# Patient Record
Sex: Female | Born: 2000 | Race: Black or African American | Hispanic: No | Marital: Single | Smoking: Never smoker
Health system: Southern US, Community
[De-identification: ages and names within clinical notes are randomized; demographics above are authoritative.]

## PROBLEM LIST (undated history)

## (undated) DIAGNOSIS — Z9109 Other allergy status, other than to drugs and biological substances: Secondary | ICD-10-CM

## (undated) DIAGNOSIS — F32A Depression, unspecified: Secondary | ICD-10-CM

---
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
## 2022-01-14 ENCOUNTER — Other Ambulatory Visit: Payer: Self-pay

## 2022-01-14 ENCOUNTER — Emergency Department (HOSPITAL_BASED_OUTPATIENT_CLINIC_OR_DEPARTMENT_OTHER)
Admission: EM | Admit: 2022-01-14 | Discharge: 2022-01-14 | Disposition: A | Discharge status: Home / Self Care | Payer: Medicaid Other | Attending: Emergency Medicine | Admitting: Emergency Medicine

## 2022-01-14 DIAGNOSIS — R109 Unspecified abdominal pain: Secondary | ICD-10-CM | POA: Diagnosis present

## 2022-01-14 DIAGNOSIS — K529 Noninfective gastroenteritis and colitis, unspecified: Secondary | ICD-10-CM | POA: Diagnosis not present

## 2022-01-14 DIAGNOSIS — E876 Hypokalemia: Secondary | ICD-10-CM

## 2022-01-14 DIAGNOSIS — R112 Nausea with vomiting, unspecified: Secondary | ICD-10-CM

## 2022-01-14 LAB — BASIC METABOLIC PANEL
Anion gap: 8 (ref 5–15)
BUN: 8 mg/dL (ref 6–20)
CO2: 23 mmol/L (ref 22–32)
Calcium: 8.4 mg/dL — ABNORMAL LOW (ref 8.9–10.3)
Chloride: 107 mmol/L (ref 98–111)
Creatinine, Ser: 0.9 mg/dL (ref 0.44–1.00)
GFR, Estimated: >60 mL/min (ref >60)
Glucose, Bld: 122 mg/dL — ABNORMAL HIGH (ref 70–99)
Potassium: 3.2 mmol/L — ABNORMAL LOW (ref 3.5–5.1)
Sodium: 138 mmol/L (ref 135–145)

## 2022-01-14 LAB — CBC
HCT: 36.9 % (ref 36.0–46.0)
Hemoglobin: 11.9 g/dL — ABNORMAL LOW (ref 12.0–15.0)
MCH: 26.7 pg (ref 26.0–34.0)
MCHC: 32.2 g/dL (ref 30.0–36.0)
MCV: 82.7 fL (ref 80.0–100.0)
Platelets: 270 10*3/uL (ref 150–400)
RBC: 4.46 MIL/uL (ref 3.87–5.11)
RDW: 14 % (ref 11.5–15.5)
WBC: 9.9 10*3/uL (ref 4.0–10.5)
nRBC: 0 % (ref 0.0–0.2)

## 2022-01-14 LAB — URINALYSIS, ROUTINE W REFLEX MICROSCOPIC
Bilirubin Urine: NEGATIVE
Glucose, UA: NEGATIVE mg/dL
Ketones, ur: NEGATIVE mg/dL
Leukocytes,Ua: NEGATIVE
Nitrite: NEGATIVE
Protein, ur: 30 mg/dL — AB
Specific Gravity, Urine: >=1.03 (ref 1.005–1.030)
pH: 5.5 (ref 5.0–8.0)

## 2022-01-14 LAB — URINALYSIS, MICROSCOPIC (REFLEX)

## 2022-01-14 LAB — PREGNANCY, URINE: Preg Test, Ur: NEGATIVE

## 2022-01-14 MED ORDER — METOCLOPRAMIDE HCL 5 MG/ML IJ SOLN: 10.0000 mg | Freq: Once | INTRAMUSCULAR | Status: DC

## 2022-01-14 MED ORDER — DIPHENHYDRAMINE HCL 50 MG/ML IJ SOLN
12.5000 mg | Freq: Once | INTRAMUSCULAR | Status: AC
  Administered 2022-01-14: 12.5 mg via INTRAVENOUS
  Filled 2022-01-14: qty 1

## 2022-01-14 MED ORDER — POTASSIUM CHLORIDE CRYS ER 20 MEQ PO TBCR: 20.0000 meq | EXTENDED_RELEASE_TABLET | Freq: Two times a day (BID) | ORAL | 0 refills | Status: DC

## 2022-01-14 MED ORDER — DROPERIDOL 2.5 MG/ML IJ SOLN
2.5000 mg | Freq: Once | INTRAMUSCULAR | Status: AC
  Administered 2022-01-14: 2.5 mg via INTRAVENOUS
  Filled 2022-01-14: qty 2

## 2022-01-14 MED ORDER — PROCHLORPERAZINE EDISYLATE 10 MG/2ML IJ SOLN
10.0000 mg | Freq: Once | INTRAMUSCULAR | Status: AC
  Administered 2022-01-14: 10 mg via INTRAVENOUS
  Filled 2022-01-14: qty 2

## 2022-01-14 MED ORDER — ONDANSETRON HCL 4 MG/2ML IJ SOLN
4.0000 mg | Freq: Once | INTRAMUSCULAR | Status: AC
  Administered 2022-01-14: 4 mg via INTRAVENOUS
  Filled 2022-01-14: qty 2

## 2022-01-14 MED ORDER — SODIUM CHLORIDE 0.9 % IV BOLUS
1000.0000 mL | Freq: Once | INTRAVENOUS | Status: AC
  Administered 2022-01-14: 1000 mL via INTRAVENOUS

## 2022-01-14 MED ORDER — ONDANSETRON HCL 4 MG PO TABS: 4.0000 mg | ORAL_TABLET | Freq: Four times a day (QID) | ORAL | 0 refills | Status: DC

## 2022-01-14 NOTE — ED Triage Notes (Signed)
Pt accompanied by mom c/o abdominal pain, vomiting x 5 started this morning, nausea since last night. Had [place]bys last night, denies other symptoms.

## 2022-01-14 NOTE — ED Notes (Signed)
Pt ambulatory to restroom

## 2022-01-14 NOTE — ED Provider Notes (Signed)
[facility] EMERGENCY DEPARTMENT Provider Note   CSN: [id number] Arrival date & time: 01/14/22  0347     History  Chief Complaint  Patient presents with   Emesis    Alison Chan is a 21 y.o. female who presents to the emergency department complaining of abdominal pain and vomiting starting this morning. Pt reports waking up the generalized pain in her abdomen and vomiting about 5 times. One episode of diarrhea. No recent travel, new foods or medications. No sick contacts. No fever, urinary symptoms, or vaginal discharge. She is currently on her menstrual cycle.    Emesis Associated symptoms: abdominal pain, chills and diarrhea   Associated symptoms: no fever       Home Medications Prior to Admission medications   Medication Sig Start Date End Date Taking? Authorizing Provider  ondansetron (ZOFRAN) 4 MG tablet Take 1 tablet (4 mg total) by mouth every 6 (six) hours. 01/14/22  Yes Rockie Vawter T, PA-C  potassium chloride SA (KLOR-CON M) 20 MEQ tablet Take 1 tablet (20 mEq total) by mouth 2 (two) times daily for 3 days. 01/14/22 01/17/22 Yes Kullen Tomasetti T, PA-C  famotidine (PEPCID) 20 MG tablet Take 1 tablet (20 mg total) by mouth 2 (two) times daily. 08/16/21   Vanetta Mulders, MD  Hydrocortisone-Aloe 0.5 % CREA APPLY TO AFFECTED AREA TWICE A DAY 05/22/21   Georges Mouse, NP  norgestimate-ethinyl estradiol (SPRINTEC 28) 0.25-35 MG-MCG tablet Take 1 tablet daily. Discard placebos and take active pills for continuous cycling 05/22/21   Georges Mouse, NP  predniSONE (DELTASONE) 10 MG tablet Take 4 tablets (40 mg total) by mouth daily. 08/16/21   Vanetta Mulders, MD      Allergies    Patient has no known allergies.    Review of Systems   Review of Systems  Constitutional:  Positive for chills. Negative for fever.  Gastrointestinal:  Positive for abdominal pain, diarrhea, nausea and vomiting. Negative for blood in stool and constipation.  Genitourinary:   Negative for dysuria, frequency, hematuria, pelvic pain, urgency and vaginal discharge.  All other systems reviewed and are negative.  Physical Exam Updated Vital Signs BP 124/81   Pulse 90   Temp 98 F (36.7 C) (Oral)   Resp 18   Ht 5\' 3"  (1.6 m)   Wt 77.1 kg   LMP 01/07/2022 (Approximate)   SpO2 99%   BMI 30.11 kg/m  Physical Exam Vitals and nursing note reviewed.  Constitutional:      General: She is awake.     Appearance: Normal appearance.     Comments: Appears tired  HENT:     Head: Normocephalic and atraumatic.  Eyes:     Conjunctiva/sclera: Conjunctivae normal.  Cardiovascular:     Rate and Rhythm: Normal rate and regular rhythm.  Pulmonary:     Effort: Pulmonary effort is normal. No respiratory distress.     Breath sounds: Normal breath sounds.  Abdominal:     General: There is no distension.     Palpations: Abdomen is soft.     Tenderness: There is generalized abdominal tenderness and tenderness in the suprapubic area. There is no guarding or rebound.  Skin:    General: Skin is warm and dry.  Neurological:     General: No focal deficit present.  Psychiatric:        Behavior: Behavior is cooperative.    ED Results / Procedures / Treatments   Labs (all labs ordered are listed, but only abnormal results  are displayed) Labs Reviewed  URINALYSIS, ROUTINE W REFLEX MICROSCOPIC - Abnormal; Notable for the following components:      Result Value   APPearance HAZY (*)    Hgb urine dipstick MODERATE (*)    Protein, ur 30 (*)    All other components within normal limits  CBC - Abnormal; Notable for the following components:   Hemoglobin 11.9 (*)    All other components within normal limits  BASIC METABOLIC PANEL - Abnormal; Notable for the following components:   Potassium 3.2 (*)    Glucose, Bld 122 (*)    Calcium 8.4 (*)    All other components within normal limits  URINALYSIS, MICROSCOPIC (REFLEX) - Abnormal; Notable for the following components:    Bacteria, UA FEW (*)    All other components within normal limits  PREGNANCY, URINE    EKG None  Radiology No results found.  Procedures Procedures    Medications Ordered in ED Medications  sodium chloride 0.9 % bolus 1,000 mL (0 mLs Intravenous Stopped 01/14/22 1130)  ondansetron (ZOFRAN) injection 4 mg (4 mg Intravenous Given 01/14/22 0917)  prochlorperazine (COMPAZINE) injection 10 mg (10 mg Intravenous Given 01/14/22 1120)  diphenhydrAMINE (BENADRYL) injection 12.5 mg (12.5 mg Intravenous Given 01/14/22 1119)  droperidol (INAPSINE) 2.5 MG/ML injection 2.5 mg (2.5 mg Intravenous Given 01/14/22 1222)    ED Course/ Medical Decision Making/ A&P                           Medical Decision Making Amount and/or Complexity of Data Reviewed Labs: ordered.  Risk Prescription drug management.  This patient is a 21 y.o. female who presents to the ED for concern of abdominal pain and vomiting, this involves an extensive number of treatment options, and is a complaint that carries with it a high risk of complications and morbidity. The emergent differential diagnosis prior to evaluation includes, but is not limited to,  Pelvic inflammatory disease, ectopic pregnancy, appendicitis, urinary calculi, primary dysmenorrhea, septic abortion, ruptured ovarian cyst or tumor, ovarian torsion, tubo-ovarian abscess, degeneration of fibroid, endometriosis, diverticulitis, cystitis. This is not an exhaustive differential.   Past Medical History / Co-morbidities / Social History: Seasonal allergies  Physical Exam: Physical exam performed. The pertinent findings include: Afebrile, normal vital signs. Abdomen soft with generalized tenderness, worse in suprapubic region without guarding.   Lab Tests: I ordered, and personally interpreted labs.  The pertinent results include:  No leukocytosis, normal hemoglobin. Mild hypokalemia of 3.2, glucose 122. Otherwise electrolytes WNL. Urinalysis with moderate  hemoglobin, likely related to menstruation, negative for infection.    Medications: I ordered medication including IV fluids and zofran  for nausea and vomiting. Reevaluation of the patient after these medicines showed that the patient improved. I have reviewed the patients home medicines and have made adjustments as needed.  Disposition: After consideration of the diagnostic results and the patients response to treatment, I feel that patient is not requiring admission. Her lab work is reassuring minus mild hypokalemia, will prescribe oral replacement. Repeat abdominal exam remains non-surgical. Will discharge to home with anti-emetics and encourage good fluid intake. Suspect likely gastroenteritis. Does not meet SIRS criteria. We discussed reasons to return to the ER, and the patient and her mother are agreeable to the plan.    Final Clinical Impression(s) / ED Diagnoses Final diagnoses:  Nausea vomiting and diarrhea  Gastroenteritis  Hypokalemia    Rx / DC Orders ED Discharge Orders  Ordered    potassium chloride SA (KLOR-CON M) 20 MEQ tablet  2 times daily        01/14/22 1250    ondansetron (ZOFRAN) 4 MG tablet  Every 6 hours        01/14/22 1250           Portions of this report may have been transcribed using voice recognition software. Every effort was made to ensure accuracy; however, inadvertent computerized transcription errors may be present.    Jeanella FlatteryRoemhildt, Jakobee Brackins T, PA-C 01/14/22 1250    Jacalyn LefevreHaviland, Julie, MD 01/15/22 0830

## 2022-01-14 NOTE — Discharge Instructions (Addendum)
You were seen in the emergency department for vomiting.  Your lab work looked reassuring today. Your potassium levels were slightly low, so I am prescribing you some potassium replacement pills that you can take once your vomiting has resolved. I'm also prescribing you some nausea medicine for home. You can take one tablet every 6 hours as needed.  I suspect your symptoms were likely related to a stomach virus. Make sure you are staying well hydrated. Continue to monitor how you're doing and return to the ER for new or worsening symptoms.

## 2022-02-19 ENCOUNTER — Ambulatory Visit (INDEPENDENT_AMBULATORY_CARE_PROVIDER_SITE_OTHER): Payer: Medicaid Other | Admitting: Pediatrics

## 2022-02-19 VITALS — BP 116/70 | HR 90 | Ht 62.6 in | Wt 167.6 lb

## 2022-02-19 DIAGNOSIS — N3001 Acute cystitis with hematuria: Secondary | ICD-10-CM

## 2022-02-19 DIAGNOSIS — Z3202 Encounter for pregnancy test, result negative: Secondary | ICD-10-CM

## 2022-02-19 DIAGNOSIS — L700 Acne vulgaris: Secondary | ICD-10-CM | POA: Diagnosis not present

## 2022-02-19 DIAGNOSIS — Z113 Encounter for screening for infections with a predominantly sexual mode of transmission: Secondary | ICD-10-CM

## 2022-02-19 DIAGNOSIS — N898 Other specified noninflammatory disorders of vagina: Secondary | ICD-10-CM | POA: Diagnosis not present

## 2022-02-19 DIAGNOSIS — Z1389 Encounter for screening for other disorder: Secondary | ICD-10-CM | POA: Diagnosis not present

## 2022-02-19 DIAGNOSIS — N921 Excessive and frequent menstruation with irregular cycle: Secondary | ICD-10-CM

## 2022-02-19 LAB — POCT URINALYSIS DIPSTICK
Bilirubin, UA: NEGATIVE
Glucose, UA: NEGATIVE
Ketones, UA: NEGATIVE
Nitrite, UA: POSITIVE
Protein, UA: POSITIVE — AB
Spec Grav, UA: 1.025 (ref 1.010–1.025)
Urobilinogen, UA: 0.2 E.U./dL
pH, UA: 5 (ref 5.0–8.0)

## 2022-02-19 LAB — POCT URINE PREGNANCY: Preg Test, Ur: NEGATIVE

## 2022-02-19 MED ORDER — PHENAZOPYRIDINE HCL 100 MG PO TABS: 100.0000 mg | ORAL_TABLET | Freq: Three times a day (TID) | ORAL | 0 refills | Status: DC | PRN

## 2022-02-19 MED ORDER — NITROFURANTOIN MONOHYD MACRO 100 MG PO CAPS: 100.0000 mg | ORAL_CAPSULE | Freq: Two times a day (BID) | ORAL | 0 refills | Status: AC

## 2022-02-19 MED ORDER — NITROFURANTOIN MONOHYD MACRO 100 MG PO CAPS: 100.0000 mg | ORAL_CAPSULE | Freq: Two times a day (BID) | ORAL | 0 refills | Status: DC

## 2022-02-19 MED ORDER — NORGESTIMATE-ETH ESTRADIOL 0.25-35 MG-MCG PO TABS: ORAL_TABLET | ORAL | 4 refills | Status: DC

## 2022-02-19 NOTE — Progress Notes (Signed)
History was provided by the patient.  Alison Chan is a 21 y.o. female who is here for vaginal concerns.  Pcp, No   HPI:  Pt reports everything was going well with her birth control but started having some bleeding and spotting. Was using tampons and had a weird vaginal feeling and looked at her vaginal area and said she saw some little holes that were weird. Looked on the internet and when she is using the bathroom she is having blood and blood clots. When she urinates she is having frequent/difficult urination. Sexually active with one female partner. Hasn't had sex for about 2 weeks d/t spotting. Prior was not having any pain with sex. No abdominal pain. Taking birth control pills regularly without missed doses. Usually doesn't have periods due to taking the pill.   No LMP recorded.  ROS  Patient Active Problem List   Diagnosis Date Noted   Iron deficiency anemia due to chronic blood loss 11/26/2020   Cystitis 10/08/2020   Vaginal discharge 10/08/2020   Menorrhagia with regular cycle 10/08/2020    Current Outpatient Medications on File Prior to Visit  Medication Sig Dispense Refill   norgestimate-ethinyl estradiol (SPRINTEC 28) 0.25-35 MG-MCG tablet Take 1 tablet daily. Discard placebos and take active pills for continuous cycling 112 tablet 4   famotidine (PEPCID) 20 MG tablet Take 1 tablet (20 mg total) by mouth 2 (two) times daily. (Patient not taking: Reported on 02/19/2022) 30 tablet 0   Hydrocortisone-Aloe 0.5 % CREA APPLY TO AFFECTED AREA TWICE A DAY (Patient not taking: Reported on 02/19/2022) 30 g 0   ondansetron (ZOFRAN) 4 MG tablet Take 1 tablet (4 mg total) by mouth every 6 (six) hours. (Patient not taking: Reported on 02/19/2022) 12 tablet 0   potassium chloride SA (KLOR-CON M) 20 MEQ tablet Take 1 tablet (20 mEq total) by mouth 2 (two) times daily for 3 days. 6 tablet 0   predniSONE (DELTASONE) 10 MG tablet Take 4 tablets (40 mg total) by mouth daily. (Patient not taking: Reported  on 02/19/2022) 20 tablet 0   No current facility-administered medications on file prior to visit.    No Known Allergies   Physical Exam:    Vitals:   02/19/22 1506  BP: 116/70  Pulse: 90  Weight: 167 lb 9.6 oz (76 kg)  Height: 5' 2.6" (1.59 m)    Blood pressure %iles are not available for patients who are 18 years or older.  Physical Exam Vitals and nursing note reviewed. Exam conducted with a chaperone present.  Constitutional:      General: She is not in acute distress.    Appearance: She is well-developed.  Neck:     Thyroid: No thyromegaly.  Cardiovascular:     Rate and Rhythm: Normal rate and regular rhythm.     Heart sounds: No murmur heard. Pulmonary:     Breath sounds: Normal breath sounds.  Abdominal:     Palpations: Abdomen is soft. There is no mass.     Tenderness: There is no abdominal tenderness. There is no right CVA tenderness, left CVA tenderness or guarding.  Genitourinary:    Labia:        Right: No rash.        Left: No rash.      Vagina: No signs of injury. Bleeding present.     Comments: Internal exam not conducted  Musculoskeletal:     Right lower leg: No edema.     Left lower leg: No edema.  Lymphadenopathy:     Cervical: No cervical adenopathy.  Skin:    General: Skin is warm.     Findings: No rash.  Neurological:     Mental Status: She is alert.     Comments: No tremor     Assessment/Plan: 1. Acute cystitis with hematuria Urine + leuks and nitrites. Will send macrobid now and send for culture. Pyridium as needed for pain.  - Urine Culture - nitrofurantoin, macrocrystal-monohydrate, (MACROBID) 100 MG capsule; Take 1 capsule (100 mg total) by mouth 2 (two) times daily for 5 days.  Dispense: 10 capsule; Refill: 0 - phenazopyridine (PYRIDIUM) 100 MG tablet; Take 1 tablet (100 mg total) by mouth 3 (three) times daily as needed for pain.  Dispense: 10 tablet; Refill: 0  2. Vaginal itching Will get wet prep and treat as indicated.  - WET  PREP BY MOLECULAR PROBE  3. Breakthrough bleeding on OCPs Dicussed can have BTB with infections but also if not taking a break sometimes. Recommended break every 3-4 months to help with this.  - norgestimate-ethinyl estradiol (SPRINTEC 28) 0.25-35 MG-MCG tablet; Take 1 tablet daily. Discard placebos and take active pills for continuous cycling  Dispense: 112 tablet; Refill: 4  4. Acne vulgaris Improved with sprintec  - norgestimate-ethinyl estradiol (SPRINTEC 28) 0.25-35 MG-MCG tablet; Take 1 tablet daily. Discard placebos and take active pills for continuous cycling  Dispense: 112 tablet; Refill: 4  5. Pregnancy examination or test, negative result Neg  - POCT urine pregnancy  6. Screening for genitourinary condition As above  - POCT urinalysis dipstick  7. Routine screening for STI (sexually transmitted infection) Per protocol.  - C. trachomatis/N. gonorrhoeae RNA  Return PRN. ED precautions given.   Alfonso Ramus, FNP

## 2022-02-19 NOTE — Patient Instructions (Addendum)
Will send additional testing and results on mychart  Start macrobid 100 mg twice daily for 5 days  Take pyridium 100 mg three times daily for urinary pain. Will turn urine orange/red  Vaginal tissue is normal  Vaseline for discomfort  Let us know if not improving

## 2022-02-20 ENCOUNTER — Telehealth: Payer: Self-pay | Admitting: *Deleted

## 2022-02-20 LAB — C. TRACHOMATIS/N. GONORRHOEAE RNA
C. trachomatis RNA, TMA: NOT DETECTED
N. gonorrhoeae RNA, TMA: NOT DETECTED

## 2022-02-20 LAB — WET PREP BY MOLECULAR PROBE
Candida species: NOT DETECTED
MICRO NUMBER:: 13613110
SPECIMEN QUALITY:: ADEQUATE
Trichomonas vaginosis: NOT DETECTED

## 2022-02-20 NOTE — Telephone Encounter (Signed)
Patient called with complaints of dizziness, sweats, fever, lightheadedness.  Called and spoke to [facility], she states she took her first dose of Macrobid yesterday evening.  This morning she went to work at 3 am, about 630 am she felt faint like she was going to pass out.  Since then she has felt dizzy and hot off and on.  She correlates the symptoms with taking the Macrobid.  Drink lots of water to stay hydrated.   Spoke to [employer].  Relayed message to patient that she needs to go to the ED if she is having a fever.  Let us know if symptoms are worsening.  Patient understanding.

## 2022-02-21 ENCOUNTER — Other Ambulatory Visit: Payer: Self-pay | Admitting: Pediatrics

## 2022-02-21 LAB — URINE CULTURE
MICRO NUMBER:: 13613122
SPECIMEN QUALITY:: ADEQUATE

## 2022-02-21 MED ORDER — METRONIDAZOLE 500 MG PO TABS
500.0000 mg | ORAL_TABLET | Freq: Two times a day (BID) | ORAL | 0 refills | Status: AC
Start: 2022-02-21 — End: 2022-02-28

## 2022-02-24 ENCOUNTER — Ambulatory Visit: Payer: Medicaid Other | Admitting: Family

## 2022-12-28 ENCOUNTER — Telehealth: Payer: Self-pay | Admitting: Family

## 2022-12-28 ENCOUNTER — Other Ambulatory Visit: Payer: Self-pay | Admitting: Family

## 2022-12-28 ENCOUNTER — Telehealth: Payer: Self-pay

## 2022-12-28 ENCOUNTER — Encounter: Payer: Self-pay | Admitting: Family

## 2022-12-28 NOTE — Telephone Encounter (Signed)
Patient called in and is requesting a refill on her norgestimate-ethinyl estradiol (SPRINTEC 28) 0.25-35 MG-MCG tablet . She wants it sent to [id number], [place] - [address]. Callback for patient is [phone]

## 2022-12-28 NOTE — Telephone Encounter (Signed)
Pt. Called in to say that pharmacy did not have her birthcontrol pills, once I verified the pharmacy she stated it was sent to the wrong one. Pt. said she will pick up the pills at the pharmacy it was sent to this one time, but going forward switch over any refills etc. to the correct pharmacy.  Medication: norgestimate-ethinyl estradiol (SPRINTEC 28) 0.25-35 MG-MCG tablet   Pharmacy:  [id number], [place] - [address]    Thank You!

## 2022-12-29 NOTE — Telephone Encounter (Signed)
Pt. Called in again and agreed to virtual appointment tomorrow 5/15 @ 9:30 am.

## 2022-12-30 ENCOUNTER — Encounter: Payer: Self-pay | Admitting: Family

## 2022-12-30 ENCOUNTER — Telehealth (INDEPENDENT_AMBULATORY_CARE_PROVIDER_SITE_OTHER): Payer: Medicaid Other | Admitting: Family

## 2022-12-30 DIAGNOSIS — L700 Acne vulgaris: Secondary | ICD-10-CM | POA: Diagnosis not present

## 2022-12-30 DIAGNOSIS — N921 Excessive and frequent menstruation with irregular cycle: Secondary | ICD-10-CM | POA: Diagnosis not present

## 2022-12-30 MED ORDER — NORGESTIMATE-ETH ESTRADIOL 0.25-35 MG-MCG PO TABS: ORAL_TABLET | ORAL | 4 refills | Status: DC

## 2022-12-30 NOTE — Progress Notes (Signed)
THIS RECORD MAY CONTAIN CONFIDENTIAL INFORMATION THAT SHOULD NOT BE RELEASED WITHOUT REVIEW OF THE SERVICE PROVIDER.  Virtual Follow-Up Visit via Video Note  I connected with Alison Chan  on 12/30/22 at  9:30 AM EDT by a video enabled telemedicine application and verified that I am speaking with the correct person using two identifiers.   Patient/parent location: home Provider location: remote [place]   I discussed the limitations of evaluation and management by telemedicine and the availability of in person appointments.  I discussed that the purpose of this telehealth visit is to provide medical care while limiting exposure to the novel coronavirus.  The patient expressed understanding and agreed to proceed.   Alison Chan is a 22 y.o. female referred by No ref. provider found here today for follow-up of continuous cycling birth control, needs refills.    History was provided by the patient.  Supervising Physician: Dr. Theadore Nan   Plan from Last Visit:   05/22/2021 1. Breakthrough bleeding on OCPs 2. Acne vulgaris  -will change from 2nd gen to 3rd gen COC for better control of acne s/s.  -remain on continuous cycling  - norgestimate-ethinyl estradiol (SPRINTEC 28) 0.25-35 MG-MCG tablet; Take 1 tablet daily. Discard placebos and take active pills for continuous cycling  Dispense: 112 tablet; Refill: 4   3. Vaginal lesion -apply to affected area twice daily; return precautions given  - hydrocortisone ointment 0.5 %; Apply 1 application topically 2 (two) times daily.  Dispense: 30 g; Refill: 0     Pertinent Labs: negative gc/c 02/19/22  Chief Complaint: Needs refills for birth control   History of Present Illness:  -since being on birth control, has not been having a period  -continuous cycling is working well  -last pill was Saturday with cramping and bleeding  -no pain with intercourse, no discharge changes, no dysuria  -no new medications, OK to take pyridium off  the med list  -needs my chart password reset   No Known Allergies Outpatient Medications Prior to Visit  Medication Sig Dispense Refill   norgestimate-ethinyl estradiol (SPRINTEC 28) 0.25-35 MG-MCG tablet Take 1 tablet daily. Discard placebos and take active pills for continuous cycling 112 tablet 4   phenazopyridine (PYRIDIUM) 100 MG tablet Take 1 tablet (100 mg total) by mouth 3 (three) times daily as needed for pain. 10 tablet 0   No facility-administered medications prior to visit.     Patient Active Problem List   Diagnosis Date Noted   Iron deficiency anemia due to chronic blood loss 11/26/2020   Cystitis 10/08/2020   Vaginal discharge 10/08/2020   Menorrhagia with regular cycle 10/08/2020   The following portions of the patient's history were reviewed and updated as appropriate: allergies, current medications, past family history, past medical history, past social history, past surgical history, and problem list.  Visual Observations/Objective:   General Appearance: Well nourished well developed, in no apparent distress.  Eyes: conjunctiva no swelling or erythema ENT/Mouth: No hoarseness, No cough for duration of visit.  Neck: Supple  Respiratory: Respiratory effort normal, normal rate, no retractions or distress.   Cardio: Appears well-perfused, noncyanotic Musculoskeletal: no obvious deformity Skin: visible skin without rashes, ecchymosis, erythema Neuro: Awake and oriented X 3,  Psych:  normal affect, Insight and Judgment appropriate.    Assessment/Plan: 1. Menorrhagia with irregular cycle 2. Acne vulgaris - norgestimate-ethinyl estradiol (SPRINTEC 28) 0.25-35 MG-MCG tablet; Take 1 tablet daily. Discard placebos and take active pills for continuous cycling  Dispense: 112 tablet; Refill: 4 -discussed  option to wait until cycle ends or restart birth control pills now to try to stop cycle  -her preference is to stop bleeding ASAP  -advised to take 2 pills (one AM and  one PM) until bleeding stops, then return to one pill daily  -discussed adverse effects of twice daily dosing including headaches, nausea -report any new or worsening symptoms   I discussed the assessment and treatment plan with the patient and/or parent/guardian.  They were provided an opportunity to ask questions and all were answered.  They agreed with the plan and demonstrated an understanding of the instructions. They were advised to call back or seek an in-person evaluation in the emergency room if the symptoms worsen or if the condition fails to improve as anticipated.   Follow-up:   as needed, next visit in person to update gc/c screening with is due 02/2023   Georges Mouse, NP    CC: Pcp, No, No ref. provider found

## 2023-01-26 ENCOUNTER — Encounter: Payer: Self-pay | Admitting: Family

## 2023-02-05 ENCOUNTER — Ambulatory Visit
Admission: EM | Admit: 2023-02-05 | Discharge: 2023-02-05 | Disposition: A | Discharge status: Home / Self Care | Payer: Medicaid Other | Attending: Urgent Care | Admitting: Urgent Care

## 2023-02-05 DIAGNOSIS — J020 Streptococcal pharyngitis: Secondary | ICD-10-CM | POA: Diagnosis not present

## 2023-02-05 LAB — POCT RAPID STREP A (OFFICE): Rapid Strep A Screen: POSITIVE — AB

## 2023-02-05 MED ORDER — AMOXICILLIN-POT CLAVULANATE 875-125 MG PO TABS: 1.0000 | ORAL_TABLET | Freq: Two times a day (BID) | ORAL | 0 refills | Status: DC

## 2023-02-05 NOTE — ED Provider Notes (Signed)
[facility]  Note:  This document was prepared using Conservation officer, historic buildings and may include unintentional dictation errors.  MRN: [id number] DOB: 09-28-2000  Subjective:   Alison Chan is a 22 y.o. female presenting for 2-day history of throat pain, painful swallowing, swollen lymph nodes.  Has history of frequent strep infections.  Is concerned that this is what it is.  Last episode was a few months ago.  Has never talked to an ENT doctor.  No current facility-administered medications for this encounter.  Current Outpatient Medications:    norgestimate-ethinyl estradiol (SPRINTEC 28) 0.25-35 MG-MCG tablet, Take 1 tablet daily. Discard placebos and take active pills for continuous cycling, Disp: 112 tablet, Rfl: 4   No Known Allergies  Past Medical History:  Diagnosis Date   Environmental allergies      History reviewed. No pertinent surgical history.  No family history on file.  Social History   Tobacco Use   Smoking status: Never   Smokeless tobacco: Never  Vaping Use   Vaping Use: Every day  Substance Use Topics   Alcohol use: Yes    Comment: occ   Drug use: Not Currently    ROS   Objective:   Vitals: BP 116/72 (BP Location: Right Arm)   Pulse 94   Temp 98.4 F (36.9 C) (Oral)   Resp 18   SpO2 98%   Physical Exam Constitutional:      General: She is not in acute distress.    Appearance: Normal appearance. She is well-developed and normal weight. She is not ill-appearing, toxic-appearing or diaphoretic.  HENT:     Head: Normocephalic and atraumatic.     Right Ear: Tympanic membrane, ear canal and external ear normal. No drainage or tenderness. No middle ear effusion. There is no impacted cerumen. Tympanic membrane is not erythematous or bulging.     Left Ear: Tympanic membrane, ear canal and external ear normal. No drainage or tenderness.  No middle ear effusion. There is no impacted cerumen. Tympanic membrane is not  erythematous or bulging.     Nose: Nose normal. No congestion or rhinorrhea.     Mouth/Throat:     Mouth: Mucous membranes are moist. No oral lesions.     Pharynx: Pharyngeal swelling, oropharyngeal exudate and posterior oropharyngeal erythema present. No uvula swelling.     Tonsils: Tonsillar exudate present. No tonsillar abscesses. 1+ on the right. 1+ on the left.  Eyes:     General: No scleral icterus.       Right eye: No discharge.        Left eye: No discharge.     Extraocular Movements: Extraocular movements intact.     Right eye: Normal extraocular motion.     Left eye: Normal extraocular motion.     Conjunctiva/sclera: Conjunctivae normal.  Cardiovascular:     Rate and Rhythm: Normal rate.  Pulmonary:     Effort: Pulmonary effort is normal.  Musculoskeletal:     Cervical back: Normal range of motion and neck supple.  Lymphadenopathy:     Cervical: No cervical adenopathy.  Skin:    General: Skin is warm and dry.  Neurological:     General: No focal deficit present.     Mental Status: She is alert and oriented to person, place, and time.  Psychiatric:        Mood and Affect: Mood normal.        Behavior: Behavior normal.     Results for orders  placed or performed during the hospital encounter of 02/05/23 (from the past 24 hour(s))  POCT rapid strep A     Status: Abnormal   Collection Time: 02/05/23  3:47 PM  Result Value Ref Range   Rapid Strep A Screen Positive (A) Negative    Assessment and Plan :   PDMP not reviewed this encounter.  1. Strep pharyngitis    Will treat for strep pharyngitis.  Patient is to start Augmentin, use supportive care otherwise.  Follow-up with ENT.  Counseled patient on potential for adverse effects with medications prescribed/recommended today, ER and return-to-clinic precautions discussed, patient verbalized understanding.    Wallis Bamberg, [place] 02/05/23 1625

## 2023-02-05 NOTE — ED Triage Notes (Signed)
Pt c/o sore throat day 2-denies fever-NAD-steady gait

## 2023-02-08 ENCOUNTER — Ambulatory Visit: Payer: Medicaid Other | Admitting: Family

## 2023-02-22 ENCOUNTER — Ambulatory Visit (INDEPENDENT_AMBULATORY_CARE_PROVIDER_SITE_OTHER): Payer: Medicaid Other | Admitting: Family

## 2023-02-22 ENCOUNTER — Other Ambulatory Visit (HOSPITAL_COMMUNITY)
Admission: RE | Admit: 2023-02-22 | Discharge: 2023-02-22 | Disposition: A | Discharge status: Home / Self Care | Payer: Medicaid Other | Source: Ambulatory Visit | Attending: Family | Admitting: Family

## 2023-02-22 ENCOUNTER — Telehealth: Payer: Self-pay

## 2023-02-22 ENCOUNTER — Encounter: Payer: Self-pay | Admitting: Family

## 2023-02-22 ENCOUNTER — Other Ambulatory Visit: Payer: Self-pay | Admitting: Family

## 2023-02-22 VITALS — BP 125/66 | HR 88 | Ht 63.0 in | Wt 189.4 lb

## 2023-02-22 DIAGNOSIS — Z113 Encounter for screening for infections with a predominantly sexual mode of transmission: Secondary | ICD-10-CM | POA: Diagnosis present

## 2023-02-22 DIAGNOSIS — R809 Proteinuria, unspecified: Secondary | ICD-10-CM

## 2023-02-22 DIAGNOSIS — R300 Dysuria: Secondary | ICD-10-CM

## 2023-02-22 DIAGNOSIS — Z3202 Encounter for pregnancy test, result negative: Secondary | ICD-10-CM | POA: Diagnosis not present

## 2023-02-22 DIAGNOSIS — E611 Iron deficiency: Secondary | ICD-10-CM

## 2023-02-22 DIAGNOSIS — N921 Excessive and frequent menstruation with irregular cycle: Secondary | ICD-10-CM

## 2023-02-22 DIAGNOSIS — F4323 Adjustment disorder with mixed anxiety and depressed mood: Secondary | ICD-10-CM | POA: Diagnosis not present

## 2023-02-22 LAB — POCT URINALYSIS DIPSTICK
Bilirubin, UA: NEGATIVE
Blood, UA: POSITIVE
Glucose, UA: NEGATIVE
Ketones, UA: NEGATIVE
Nitrite, UA: NEGATIVE
Protein, UA: POSITIVE — AB
Spec Grav, UA: 1.02 (ref 1.010–1.025)
Urobilinogen, UA: 0.2 E.U./dL
pH, UA: 5 (ref 5.0–8.0)

## 2023-02-22 LAB — POCT HEMOGLOBIN: Hemoglobin: 12 g/dL (ref 11–14.6)

## 2023-02-22 LAB — POCT URINE PREGNANCY: Preg Test, Ur: NEGATIVE

## 2023-02-22 MED ORDER — BUPROPION HCL ER (XL) 150 MG PO TB24
150.0000 mg | ORAL_TABLET | Freq: Every day | ORAL | 0 refills | Status: DC
Start: 2023-02-22 — End: 2024-01-18

## 2023-02-22 NOTE — Progress Notes (Signed)
History was provided by the patient.  Alison Chan is a 22 y.o. female who is here for STI testing.   PCP confirmed? Yes.     Pcp, No  HPI:   - strep 1 week ago, finished abx last Monday 7/1 (symptoms resolved). Towards the end or treatment, felt vaginal irritation and took azo. Has had abdominal pain and cramping for a couple of days with frequent urination. Brief period of dysuria over the weekend but it stopped. Has lower back pain but says that is chronic. Thinks she has been having fever because she gets real ly hot at least once a day for the last 4 days. No vaginal discharge. Has had loose stools for 3 days, not smelly. Diarrhea is better as of this morning. No vulvovaginal redness, swelling, itching. Last sexually active 3 weeks ago, used a condom. Has had 2 partners in the last 6 months.  - concerned about her weight gain within the last year. Feels disgusting. Started going to the gym a couple days ago. Occasionally eats fast food. Some days does not ea t at all because she works third shift and forgets.  - Not having periods, spots a small amount. Continuous cycling. No dysmenorrhea or menorrhagia.  - Feels emotional. Has periods when she feels depressed. Worse in the fall and winter and feels like she has seasonal depression. Sister takes medication for depression. She thinks she may need medication for depression. Did not like therapy when she tried it. Denies marijuana, cocaine, street drug use. Occasional social drinking with friends but not often. Does vape every day to help with anxiety. Has been told she may be ADHD before but feels like her anxious and sad symptoms are worse.  - No seizure history  ROS All others negative except otherwise noted above in HPI  Patient Active Problem List   Diagnosis Date Noted   Iron deficiency anemia due to chronic blood loss 11/26/2020   Cystitis 10/08/2020   Vaginal discharge 10/08/2020   Menorrhagia with regular cycle 10/08/2020     Current Outpatient Medications on File Prior to Visit  Medication Sig Dispense Refill   norgestimate-ethinyl estradiol (SPRINTEC 28) 0.25-35 MG-MCG tablet Take 1 tablet daily. Discard placebos and take active pills for continuous cycling 112 tablet 4   amoxicillin-clavulanate (AUGMENTIN) 875-125 MG tablet Take 1 tablet by mouth 2 (two) times daily. (Patient not taking: Reported on 02/22/2023) 20 tablet 0   No current facility-administered medications on file prior to visit.    No Known Allergies  Physical Exam:    Vitals:   02/22/23 0839  BP: 125/66  Pulse: 88  Weight: 189 lb 6.4 oz (85.9 kg)  Height: 5\' 3"  (1.6 m)    Blood pressure %iles are not available for patients who are 18 years or older. No LMP recorded. (Menstrual status: Oral contraceptives).  Flowsheet Row Office Visit from [facility]/22/2023 in [facility]  PHQ-9 Total Score 9        General: Alert, well-appearing in NAD. Talkative and smiling.  HEENT: Normocephalic, No signs of head trauma. PERRL. EOM intact. Sclerae are anicteric. Moist mucous membranes.  Neck: Supple, no meningismus. No thyromegaly. Cardiovascular: Regular rate and rhythm, S1 and S2 normal. No murmur. Cap refill <2 seconds.  Pulmonary: Normal work of breathing. Clear to auscultation bilaterally with no wheezes or crackles present. Abdomen: Soft, non-tender, non-distended. Extremities: Warm and well-perfused, without cyanosis or edema.  Neurologic: No focal deficits Skin: No rashes  or lesions. Psych: Mood and affect are appropriate.   Assessment/Plan: 22 y.o female presents for routine STI testing.   1. Menorrhagia with irregular cycle: Currently controlled with Sprintec continuous cycling (no menorrhagia or dysmenorrhea). History of iron deficiency anemia but POC hemoglobin stable at 12.0 without iron supplementation. Negative UPT today.  - Continue Sprintec  continuous cycling   2. Routine  screening for STI (sexually transmitted infection) - Urine cytology ancillary only - WET PREP BY MOLECULAR PROBE  3. Adjustment disorder with mixed anxiety and depressed mood: PHQ-9 score of 9 with no active or passive SI. Interested in starting medication to help with anxious thoughts and feelings of sadness. Family history of depression in sister who is treated with SSRI. No h/o seizure. Given recent weight gain with weight concern, along with h/o attention deficits, will prescribe Wellbutrin today. Will follow up mood in 2 weeks. If ongoing concern for ADHD, could consider further evaluating with ADHD pathway with [facility]. - buPROPion (WELLBUTRIN XL) 150 MG 24 hr tablet; Take 1 tablet (150 mg total) by mouth daily.  Dispense: 30 tablet; Refill: 0  5. Dysuria: POCT urinalysis dipstick with 3+ leukocytes, protein, and hematuria after recent antibiotic use. Dysuria and frequent urination have both resolved. Differential includes STI, UTI, vaginal candidiasis, PSGN. Denies abnormal discharge or dark colored urine. No gross hematuria. No fever. It is possible she had a UTI that she has since cleared without antibiotics. GC/C testing sent along with wet prep to evaluate for STI and yeast. Will send culture to follow up abnormal UA.  - Follow up GC/C testing - Follow up wet prep  - Follow up urine culture  - If worsening symptoms with dark of bloody urine, consider PSGN - Advised maintaining adequate hydration with goal of 128 oz of fluid/day  Follow-up: 2 weeks to discuss mood after Wellbutrin initiation   Tereasa Coop, DO Pediatrics, PGY-3

## 2023-02-22 NOTE — Telephone Encounter (Signed)
[place] declined initiation of ADHD pathway at this time.

## 2023-02-23 ENCOUNTER — Other Ambulatory Visit: Payer: Self-pay | Admitting: Family

## 2023-02-23 LAB — URINE CULTURE

## 2023-02-23 LAB — WET PREP BY MOLECULAR PROBE
Candida species: NOT DETECTED
MICRO NUMBER:: 15170483
SPECIMEN QUALITY:: ADEQUATE
Trichomonas vaginosis: NOT DETECTED

## 2023-02-23 MED ORDER — METRONIDAZOLE 500 MG PO TABS: 500.0000 mg | ORAL_TABLET | Freq: Two times a day (BID) | ORAL | 0 refills | Status: AC

## 2023-02-24 ENCOUNTER — Ambulatory Visit
Admission: EM | Admit: 2023-02-24 | Discharge: 2023-02-24 | Disposition: A | Discharge status: Home / Self Care | Payer: Medicaid Other | Attending: Internal Medicine | Admitting: Internal Medicine

## 2023-02-24 ENCOUNTER — Other Ambulatory Visit: Payer: Self-pay | Admitting: Family

## 2023-02-24 ENCOUNTER — Ambulatory Visit (HOSPITAL_COMMUNITY): Admit: 2023-02-24 | Discharge status: Against Medical Advice | Payer: Medicaid Other | Source: Home / Self Care

## 2023-02-24 DIAGNOSIS — R1030 Lower abdominal pain, unspecified: Secondary | ICD-10-CM

## 2023-02-24 DIAGNOSIS — N3001 Acute cystitis with hematuria: Secondary | ICD-10-CM

## 2023-02-24 DIAGNOSIS — Z3202 Encounter for pregnancy test, result negative: Secondary | ICD-10-CM

## 2023-02-24 DIAGNOSIS — R110 Nausea: Secondary | ICD-10-CM

## 2023-02-24 HISTORY — DX: Depression, unspecified: F32.A

## 2023-02-24 LAB — POCT URINALYSIS DIP (MANUAL ENTRY)
Bilirubin, UA: NEGATIVE
Glucose, UA: NEGATIVE mg/dL
Ketones, POC UA: NEGATIVE mg/dL
Nitrite, UA: NEGATIVE
Protein Ur, POC: 100 mg/dL — AB
Spec Grav, UA: 1.015 (ref 1.010–1.025)
Urobilinogen, UA: 0.2 E.U./dL
pH, UA: 6.5 (ref 5.0–8.0)

## 2023-02-24 LAB — URINE CYTOLOGY ANCILLARY ONLY
Bacterial Vaginitis-Urine: NEGATIVE
Candida Urine: NEGATIVE
Chlamydia: NEGATIVE
Comment: NEGATIVE
Comment: NEGATIVE
Comment: NORMAL
Neisseria Gonorrhea: NEGATIVE
Trichomonas: NEGATIVE

## 2023-02-24 LAB — POCT URINE PREGNANCY: Preg Test, Ur: NEGATIVE

## 2023-02-24 MED ORDER — ONDANSETRON 4 MG PO TBDP: 4.0000 mg | ORAL_TABLET | Freq: Three times a day (TID) | ORAL | 0 refills | Status: DC | PRN

## 2023-02-24 MED ORDER — NITROFURANTOIN MONOHYD MACRO 100 MG PO CAPS: 100.0000 mg | ORAL_CAPSULE | Freq: Two times a day (BID) | ORAL | 0 refills | Status: DC

## 2023-02-24 NOTE — ED Triage Notes (Signed)
Pt reports abdominal cramps and diarrhea x 1 week. Pt has not taken any meds for complaints.

## 2023-02-24 NOTE — ED Provider Notes (Signed)
[facility]    CSN: [id number] Arrival date & time: 02/24/23  1212      History   Chief Complaint Chief Complaint  Patient presents with   Abdominal Pain    HPI Alison Chan is a 22 y.o. female.   Patient presents to urgent care for evaluation of abdominal cramping and diarrhea that started 1 week ago. Last bowel movement was this morning and loose/watery.  Reports intermittent nausea without vomiting. Reports lower bilateral abdominal pain that is worse with certain positions and movement. Nothing makes the pain better. She is not currently experiencing any abdominal pain. No blood/mucous to the stools.  No urinary symptoms or vaginal symptoms. Takes oral contraception, does not have withdrawal bleeds. She went to her PCP and was diagnosed with BV and UTI. Macrobid and flagyl were sent to pharmacy by PCP but patient was unaware of this and has not started taking medicines. She now remembers that has been taking AZO for dysuria. No other attempted use of any OTC medicines.    Abdominal Pain   Past Medical History:  Diagnosis Date   Depression    Environmental allergies     Patient Active Problem List   Diagnosis Date Noted   Iron deficiency anemia due to chronic blood loss 11/26/2020   Cystitis 10/08/2020   Vaginal discharge 10/08/2020   Menorrhagia with regular cycle 10/08/2020    History reviewed. No pertinent surgical history.  OB History   No obstetric history on file.      Home Medications    Prior to Admission medications   Medication Sig Start Date End Date Taking? Authorizing Provider  ondansetron (ZOFRAN-ODT) 4 MG disintegrating tablet Take 1 tablet (4 mg total) by mouth every 8 (eight) hours as needed for nausea or vomiting. 02/24/23  Yes Carlisle Beers, FNP  buPROPion (WELLBUTRIN XL) 150 MG 24 hr tablet Take 1 tablet (150 mg total) by mouth daily. 02/22/23   Shropshire, Beatriz, DO  metroNIDAZOLE (FLAGYL) 500 MG tablet Take 1 tablet (500 mg  total) by mouth 2 (two) times daily for 7 days. 02/23/23 03/02/23  Georges Mouse, NP  nitrofurantoin, macrocrystal-monohydrate, (MACROBID) 100 MG capsule Take 1 capsule (100 mg total) by mouth 2 (two) times daily. 02/24/23   Georges Mouse, NP  norgestimate-ethinyl estradiol (SPRINTEC 28) 0.25-35 MG-MCG tablet Take 1 tablet daily. Discard placebos and take active pills for continuous cycling 12/30/22   Georges Mouse, NP    Family History History reviewed. No pertinent family history.  Social History Social History   Tobacco Use   Smoking status: Never   Smokeless tobacco: Never  Vaping Use   Vaping Use: Every day  Substance Use Topics   Alcohol use: Yes    Comment: occ   Drug use: Not Currently     Allergies   Patient has no known allergies.   Review of Systems Review of Systems  Gastrointestinal:  Positive for abdominal pain.  Per HPI   Physical Exam Triage Vital Signs ED Triage Vitals  Enc Vitals Group     BP 02/24/23 1228 117/71     Pulse Rate 02/24/23 1228 84     Resp 02/24/23 1228 16     Temp 02/24/23 1228 98.7 F (37.1 C)     Temp Source 02/24/23 1228 Oral     SpO2 02/24/23 1228 97 %     Weight --      Height --      Head Circumference --  Peak Flow --      Pain Score 02/24/23 1231 8     Pain Loc --      Pain Edu? --      Excl. in GC? --    No data found.  Updated Vital Signs BP 117/71 (BP Location: Left Arm)   Pulse 84   Temp 98.7 F (37.1 C) (Oral)   Resp 16   SpO2 97%   Visual Acuity Right Eye Distance:   Left Eye Distance:   Bilateral Distance:    Right Eye Near:   Left Eye Near:    Bilateral Near:     Physical Exam Vitals and nursing note reviewed.  Constitutional:      Appearance: She is not ill-appearing or toxic-appearing.  HENT:     Head: Normocephalic and atraumatic.     Right Ear: Hearing and external ear normal.     Left Ear: Hearing and external ear normal.     Nose: Nose normal.     Mouth/Throat:     Lips:  Pink.  Eyes:     General: Lids are normal. Vision grossly intact. Gaze aligned appropriately.     Extraocular Movements: Extraocular movements intact.     Conjunctiva/sclera: Conjunctivae normal.  Cardiovascular:     Rate and Rhythm: Normal rate and regular rhythm.     Heart sounds: Normal heart sounds, S1 normal and S2 normal.  Pulmonary:     Effort: Pulmonary effort is normal. No respiratory distress.     Breath sounds: Normal breath sounds and air entry.  Abdominal:     General: Bowel sounds are normal.     Palpations: Abdomen is soft.     Tenderness: There is no abdominal tenderness. There is no right CVA tenderness, left CVA tenderness or guarding.  Musculoskeletal:     Cervical back: Neck supple.  Skin:    General: Skin is warm and dry.     Capillary Refill: Capillary refill takes less than 2 seconds.     Findings: No rash.  Neurological:     General: No focal deficit present.     Mental Status: She is alert and oriented to person, place, and time. Mental status is at baseline.     Cranial Nerves: No dysarthria or facial asymmetry.  Psychiatric:        Mood and Affect: Mood normal.        Speech: Speech normal.        Behavior: Behavior normal.        Thought Content: Thought content normal.        Judgment: Judgment normal.      UC Treatments / Results  Labs (all labs ordered are listed, but only abnormal results are displayed) Labs Reviewed  POCT URINALYSIS DIP (MANUAL ENTRY) - Abnormal; Notable for the following components:      Result Value   Blood, UA moderate (*)    Protein Ur, POC =100 (*)    Leukocytes, UA Small (1+) (*)    All other components within normal limits  POCT URINE PREGNANCY    EKG   Radiology No results found.  Procedures Procedures (including critical care time)  Medications Ordered in UC Medications - No data to display  Initial Impression / Assessment and Plan / UC Course  I have reviewed the triage vital signs and the nursing  notes.  Pertinent labs & imaging results that were available during my care of the patient were reviewed by me and considered in my  medical decision making (see chart for details).   1. Acute cystitis with hematuria, BV, nausea without vomiting, abdominal pain On chart review, patient recently went to her PCP yesterday and urine culture is positive for urinary tract infection with greater than 1000 colonies of gram-negative bacilli.  Advised to pick up Macrobid from pharmacy sent in by PCP for UTI.  This is likely the cause of patient's abdominal discomfort.  No signs of systemic illness/pyelonephritis.  May use Tylenol as needed for pain.  Advised to pick up Flagyl sent to pharmacy by PCP for bacterial vaginosis shown on wet prep performed at clinic yesterday.  May use Zofran 4 mg every 8 hours as needed for nausea and vomiting.  May use over-the-counter Imodium as needed for diarrhea.  No peritoneal signs to abdominal exam.  Currently asymptomatic.  Urine pregnancy test is negative.  Urinalysis shows signs of urinary tract infection.  No indication to repeat urine culture today.  Discussed red flag signs and symptoms of worsening condition,when to call the PCP office, return to urgent care, and when to seek higher level of care in the emergency department. Counseled patient regarding appropriate use of medications and potential side effects for all medications recommended or prescribed today. Patient verbalizes understanding and agreement with plan. Discharged in stable condition.    Final Clinical Impressions(s) / UC Diagnoses   Final diagnoses:  Acute cystitis with hematuria     Discharge Instructions      Pick up medicines for UTI prescribed by PCP and take them. Take Flagyl antibiotic for BV infection. These infections are likely the cause of your abdominal pain.  Take zofran 4mg  underneath the tongue every 8 hours as needed for nausea.  Drink plenty of water to stay well  hydrated and eat bland foods that are easy for your belly to digest.   If you develop any new or worsening symptoms or if your symptoms do not start to improve, pleases return here or follow-up with your primary care provider. If your symptoms are severe, please go to the emergency room.     ED Prescriptions     Medication Sig Dispense Auth. Provider   ondansetron (ZOFRAN-ODT) 4 MG disintegrating tablet Take 1 tablet (4 mg total) by mouth every 8 (eight) hours as needed for nausea or vomiting. 20 tablet Carlisle Beers, FNP      PDMP not reviewed this encounter.   Carlisle Beers, [place] 02/24/23 1324

## 2023-02-24 NOTE — Discharge Instructions (Addendum)
Pick up medicines for UTI prescribed by PCP and take them. Take Flagyl antibiotic for BV infection. These infections are likely the cause of your abdominal pain.  Take zofran 4mg  underneath the tongue every 8 hours as needed for nausea.  Drink plenty of water to stay well hydrated and eat bland foods that are easy for your belly to digest.   If you develop any new or worsening symptoms or if your symptoms do not start to improve, pleases return here or follow-up with your primary care provider. If your symptoms are severe, please go to the emergency room.

## 2023-02-25 ENCOUNTER — Encounter: Payer: Self-pay | Admitting: Family

## 2023-02-25 ENCOUNTER — Other Ambulatory Visit: Payer: Self-pay | Admitting: Family

## 2023-02-25 LAB — URINE CULTURE
MICRO NUMBER:: 15170484
SPECIMEN QUALITY:: ADEQUATE

## 2023-02-25 MED ORDER — SULFAMETHOXAZOLE-TRIMETHOPRIM 800-160 MG PO TABS: 1.0000 | ORAL_TABLET | Freq: Two times a day (BID) | ORAL | 0 refills | Status: AC

## 2023-02-25 NOTE — Progress Notes (Signed)
Supervising Provider Co-Signature  I reviewed with the resident the medical history and the resident's findings on physical examination.  I discussed with the resident the patient's diagnosis and concur with the treatment plan as documented in the resident's note.  Rey Dansby M Adlai Sinning, NP 

## 2023-03-09 ENCOUNTER — Encounter: Payer: Medicaid Other | Admitting: Family

## 2023-04-05 ENCOUNTER — Telehealth: Payer: Self-pay

## 2023-04-05 NOTE — Telephone Encounter (Signed)
Called patient to schedule follow up appointment with [facility] per patient she have moved back to [place] for school no concerns or symptoms at this time patient states that she will call back when she is in [place] to schedule an appointment.

## 2023-08-19 ENCOUNTER — Ambulatory Visit
Admission: EM | Admit: 2023-08-19 | Discharge: 2023-08-19 | Disposition: A | Discharge status: Home / Self Care | Payer: Medicaid Other | Attending: Family Medicine | Admitting: Family Medicine

## 2023-08-19 ENCOUNTER — Encounter: Payer: Self-pay | Admitting: Family

## 2023-08-19 DIAGNOSIS — J029 Acute pharyngitis, unspecified: Secondary | ICD-10-CM

## 2023-08-19 DIAGNOSIS — B349 Viral infection, unspecified: Secondary | ICD-10-CM | POA: Diagnosis present

## 2023-08-19 LAB — POCT RAPID STREP A (OFFICE): Rapid Strep A Screen: NEGATIVE

## 2023-08-19 MED ORDER — LIDOCAINE VISCOUS HCL 2 % MT SOLN
15.0000 mL | Freq: Four times a day (QID) | OROMUCOSAL | 0 refills | Status: DC | PRN
Start: 2023-08-19 — End: 2024-01-18

## 2023-08-19 NOTE — Discharge Instructions (Signed)
 The clinic will contact you with results of the strep throat culture done today if positive.  You may use a lidocaine  gargle as needed for your sore throat.  Gargle and spit do not swallow.  He may also do salt water gargles and warm liquids such as teas and honey.  Over-the-counter Tylenol  or ibuprofen  as needed.  Lots of rest and fluids.  Please follow-up with your PCP in 2 to 3 days for recheck.  Please go to the ER for any worsening symptoms.  I hope you feel better soon!

## 2023-08-19 NOTE — ED Provider Notes (Signed)
 [facility]    CSN: [id number] Arrival date & time: 08/19/23  1546      History   Chief Complaint No chief complaint on file.   HPI Alison Chan is a 23 y.o. female  presents for evaluation of URI symptoms for 1 days. Patient reports associated symptoms of cough, congestion, sore throat, body aches, diarrhea. Denies nausea, vomiting, fevers, ear pain, shortness of breath. Patient does not have a hx of asthma.  Patient does vape.  Reports no sick contacts.  States she was treated for strep throat in November with complete resolution of symptoms.  Pt has taken nothing OTC for symptoms. Pt has no other concerns at this time.   HPI  Past Medical History:  Diagnosis Date   Depression    Environmental allergies     Patient Active Problem List   Diagnosis Date Noted   Iron deficiency anemia due to chronic blood loss 11/26/2020   Cystitis 10/08/2020   Vaginal discharge 10/08/2020   Menorrhagia with regular cycle 10/08/2020    History reviewed. No pertinent surgical history.  OB History   No obstetric history on file.      Home Medications    Prior to Admission medications   Medication Sig Start Date End Date Taking? Authorizing Provider  lidocaine  (XYLOCAINE ) 2 % solution Use as directed 15 mLs in the mouth or throat every 6 (six) hours as needed (sore throat). Gargle and spit do not swallow 08/19/23  Yes Porshia Blizzard, Jodi R, NP  norgestimate -ethinyl estradiol  (SPRINTEC 28) 0.25-35 MG-MCG tablet Take 1 tablet daily. Discard placebos and take active pills for continuous cycling 12/30/22  Yes Joshua Bari HERO, NP  buPROPion  (WELLBUTRIN  XL) 150 MG 24 hr tablet Take 1 tablet (150 mg total) by mouth daily. 02/22/23   Shropshire, Beatriz, DO  ondansetron  (ZOFRAN -ODT) 4 MG disintegrating tablet Take 1 tablet (4 mg total) by mouth every 8 (eight) hours as needed for nausea or vomiting. 02/24/23   Enedelia Dorna HERO, FNP    Family History History reviewed. No pertinent family  history.  Social History Social History   Tobacco Use   Smoking status: Never   Smokeless tobacco: Never  Vaping Use   Vaping status: Every Day  Substance Use Topics   Alcohol use: Yes    Comment: occ   Drug use: Not Currently     Allergies   Patient has no known allergies.   Review of Systems Review of Systems  HENT:  Positive for congestion and sore throat.   Respiratory:  Positive for cough.   Gastrointestinal:  Positive for diarrhea.  Musculoskeletal:  Positive for myalgias.     Physical Exam Triage Vital Signs ED Triage Vitals  Encounter Vitals Group     BP 08/19/23 1705 116/74     Systolic BP Percentile --      Diastolic BP Percentile --      Pulse Rate 08/19/23 1705 97     Resp 08/19/23 1705 16     Temp 08/19/23 1705 99 F (37.2 C)     Temp Source 08/19/23 1705 Oral     SpO2 08/19/23 1705 97 %     Weight --      Height --      Head Circumference --      Peak Flow --      Pain Score 08/19/23 1703 4     Pain Loc --      Pain Education --      Exclude from  Growth Chart --    No data found.  Updated Vital Signs BP 116/74 (BP Location: Left Arm)   Pulse 97   Temp 99 F (37.2 C) (Oral)   Resp 16   SpO2 97%   Visual Acuity Right Eye Distance:   Left Eye Distance:   Bilateral Distance:    Right Eye Near:   Left Eye Near:    Bilateral Near:     Physical Exam Vitals and nursing note reviewed.  Constitutional:      General: She is not in acute distress.    Appearance: She is well-developed. She is not ill-appearing.  HENT:     Head: Normocephalic and atraumatic.     Right Ear: Tympanic membrane and ear canal normal.     Left Ear: Tympanic membrane and ear canal normal.     Nose: Congestion present.     Mouth/Throat:     Mouth: Mucous membranes are moist.     Pharynx: Oropharynx is clear. Uvula midline. Posterior oropharyngeal erythema present.     Tonsils: No tonsillar exudate or tonsillar abscesses.  Eyes:     Conjunctiva/sclera:  Conjunctivae normal.     Pupils: Pupils are equal, round, and reactive to light.  Cardiovascular:     Rate and Rhythm: Normal rate and regular rhythm.     Heart sounds: Normal heart sounds.  Pulmonary:     Effort: Pulmonary effort is normal.     Breath sounds: Normal breath sounds.  Musculoskeletal:     Cervical back: Normal range of motion and neck supple.  Lymphadenopathy:     Cervical: No cervical adenopathy.  Skin:    General: Skin is warm and dry.  Neurological:     General: No focal deficit present.     Mental Status: She is alert and oriented to person, place, and time.  Psychiatric:        Mood and Affect: Mood normal.        Behavior: Behavior normal.      UC Treatments / Results  Labs (all labs ordered are listed, but only abnormal results are displayed) Labs Reviewed  CULTURE, GROUP A STREP [facility])  POCT RAPID STREP A (OFFICE)    EKG   Radiology No results found.  Procedures Procedures (including critical care time)  Medications Ordered in UC Medications - No data to display  Initial Impression / Assessment and Plan / UC Course  I have reviewed the triage vital signs and the nursing notes.  Pertinent labs & imaging results that were available during my care of the patient were reviewed by me and considered in my medical decision making (see chart for details).     Reviewed exam and symptoms with patient.  No red flags.  Negative rapid strep, will culture and contact for any positive results.  Discussed viral illness and symptomatic treatment.  Lidocaine  gargle as needed.  PCP follow-up 2 to 3 days for recheck.  ER precautions reviewed. Final Clinical Impressions(s) / UC Diagnoses   Final diagnoses:  Sore throat  Viral illness     Discharge Instructions      The clinic will contact you with results of the strep throat culture done today if positive.  You may use a lidocaine  gargle as needed for your sore throat.  Gargle and spit do not swallow.   He may also do salt water gargles and warm liquids such as teas and honey.  Over-the-counter Tylenol  or ibuprofen  as needed.  Lots of rest and fluids.  Please follow-up with your PCP in 2 to 3 days for recheck.  Please go to the ER for any worsening symptoms.  I hope you feel better soon!    ED Prescriptions     Medication Sig Dispense Auth. Provider   lidocaine  (XYLOCAINE ) 2 % solution Use as directed 15 mLs in the mouth or throat every 6 (six) hours as needed (sore throat). Gargle and spit do not swallow 100 mL Orvile Corona, Jodi R, NP      PDMP not reviewed this encounter.   Loreda Myla SAUNDERS, NP 08/19/23 1730

## 2023-08-19 NOTE — ED Triage Notes (Signed)
 Pt presents to UC for c/o sore throat and body aches since yesterday. Hx strep throat 2 months ago

## 2023-08-22 LAB — CULTURE, GROUP A STREP (THRC)

## 2024-01-15 ENCOUNTER — Other Ambulatory Visit: Payer: Self-pay | Admitting: Family

## 2024-01-15 DIAGNOSIS — L700 Acne vulgaris: Secondary | ICD-10-CM

## 2024-01-15 DIAGNOSIS — N921 Excessive and frequent menstruation with irregular cycle: Secondary | ICD-10-CM

## 2024-01-16 ENCOUNTER — Encounter: Payer: Self-pay | Admitting: Family

## 2024-01-16 ENCOUNTER — Other Ambulatory Visit: Payer: Self-pay | Admitting: Family

## 2024-01-16 DIAGNOSIS — N921 Excessive and frequent menstruation with irregular cycle: Secondary | ICD-10-CM

## 2024-01-16 DIAGNOSIS — L700 Acne vulgaris: Secondary | ICD-10-CM

## 2024-01-17 ENCOUNTER — Telehealth: Payer: Self-pay

## 2024-01-17 NOTE — Telephone Encounter (Signed)
 Alison Chan NUMBER:  [phone]  MEDICATION(S): sprintec   PREFERRED PHARMACY: [facility] on [address]   ARE YOU CURRENTLY COMPLETELY OUT OF THE MEDICATION? :  yes

## 2024-01-18 ENCOUNTER — Ambulatory Visit (INDEPENDENT_AMBULATORY_CARE_PROVIDER_SITE_OTHER): Admitting: Family

## 2024-01-18 ENCOUNTER — Other Ambulatory Visit (HOSPITAL_COMMUNITY)
Admission: RE | Admit: 2024-01-18 | Discharge: 2024-01-18 | Disposition: A | Discharge status: Home / Self Care | Source: Ambulatory Visit | Attending: Family | Admitting: Family

## 2024-01-18 VITALS — BP 117/71 | HR 89 | Ht 62.6 in | Wt 208.4 lb

## 2024-01-18 DIAGNOSIS — Z68.41 Body mass index (BMI) pediatric, 5th percentile to less than 85th percentile for age: Secondary | ICD-10-CM

## 2024-01-18 DIAGNOSIS — L700 Acne vulgaris: Secondary | ICD-10-CM

## 2024-01-18 DIAGNOSIS — N921 Excessive and frequent menstruation with irregular cycle: Secondary | ICD-10-CM

## 2024-01-18 DIAGNOSIS — Z113 Encounter for screening for infections with a predominantly sexual mode of transmission: Secondary | ICD-10-CM | POA: Diagnosis present

## 2024-01-18 DIAGNOSIS — E559 Vitamin D deficiency, unspecified: Secondary | ICD-10-CM

## 2024-01-18 DIAGNOSIS — F4323 Adjustment disorder with mixed anxiety and depressed mood: Secondary | ICD-10-CM

## 2024-01-18 DIAGNOSIS — R635 Abnormal weight gain: Secondary | ICD-10-CM | POA: Diagnosis not present

## 2024-01-18 DIAGNOSIS — Z3202 Encounter for pregnancy test, result negative: Secondary | ICD-10-CM

## 2024-01-18 DIAGNOSIS — Z6837 Body mass index (BMI) 37.0-37.9, adult: Secondary | ICD-10-CM | POA: Diagnosis not present

## 2024-01-18 LAB — POCT URINE PREGNANCY: Preg Test, Ur: NEGATIVE

## 2024-01-18 MED ORDER — NORGESTIMATE-ETH ESTRADIOL 0.25-35 MG-MCG PO TABS
ORAL_TABLET | ORAL | 4 refills | Status: DC
Start: 2024-01-18 — End: 2024-03-13

## 2024-01-18 MED ORDER — BUPROPION HCL ER (XL) 150 MG PO TB24: 150.0000 mg | ORAL_TABLET | Freq: Every day | ORAL | 0 refills | Status: DC

## 2024-01-18 NOTE — Patient Instructions (Signed)
 Start Wellbutrin  XR 150 mg in morning.

## 2024-01-18 NOTE — Progress Notes (Unsigned)
 History was provided by the {[id number]}.  Alison Chan is a 23 y.o. female who is here for ***.   PCP confirmed? 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    -has been skipping periods with continuous cycling  -will only spot if she is late with pills  -no cramping  -no pain with intercourse -no vaginal discharge changes -no acne, skin is clear    -asking if able to refill the Wellbutrin ; really needs help  -has been on stairclimber and doing some squats   -2023 and 2024 has been 150, 160s -does not eat breakfast, works 3rd shift then eats a little ([employer])  -last time she took Wellbutrin  was last year, last refill  -can tell a big difference  -concern for weight gain; DM runs in her family  -drinks a lot of water daily, about 5 kitchen cups per day      01/18/2024    4:59 PM 02/22/2023    9:27 AM 03/05/2021    4:22 PM  PHQ-SADS Last 3 Score only  PHQ-15 Score 9 12 11   Total GAD-7 Score 10 4 8   PHQ Adolescent Score 8 9 14    ASRS Completed on 01/18/24 Part A:  0/6 Part B:  0/12   Review of Systems  Constitutional: Negative.   Eyes: Negative.   Respiratory:  Positive for shortness of breath (rare, will use 4-4-4 breathing to help).   Cardiovascular:  Positive for chest pain (few minutes when on stairmaster the other day) and orthopnea (will feel this when lying down - the last 3 months (3 x/month)).  Gastrointestinal: Negative.   Genitourinary:  Positive for frequency and urgency.  Musculoskeletal: Negative.   Skin: Negative.   Neurological:  Positive for dizziness (sometimes at work). Negative for tingling, seizures, loss of consciousness and headaches.  Endo/Heme/Allergies:  Positive for environmental allergies.  Does not bruise/bleed easily.     Patient Active Problem List   Diagnosis Date Noted   Iron deficiency anemia due to chronic blood loss 11/26/2020   Cystitis 10/08/2020   Vaginal discharge 10/08/2020   Menorrhagia with regular cycle 10/08/2020    Current Outpatient Medications on File Prior to Visit  Medication Sig Dispense Refill   buPROPion  (WELLBUTRIN  XL) 150 MG 24 hr tablet Take 1 tablet (150 mg total) by mouth daily. (Patient not taking: Reported on 01/18/2024) 30 tablet 0   lidocaine  (XYLOCAINE ) 2 % solution Use as directed 15 mLs in the mouth or throat every 6 (six) hours as needed (sore throat). Gargle and spit do not swallow 100 mL 0   norgestimate -ethinyl estradiol  (SPRINTEC 28) 0.25-35 MG-MCG tablet Take 1 tablet daily. Discard placebos and take active pills for continuous cycling (Patient not taking: Reported on 01/18/2024) 112 tablet 4   ondansetron  (ZOFRAN -ODT) 4 MG disintegrating tablet Take 1 tablet (4 mg total) by mouth every 8 (eight) hours as needed for nausea or vomiting. 20 tablet 0   No current facility-administered medications on file prior to visit.    No Known Allergies  Physical Exam:    Vitals:   01/18/24 1447  BP: 117/71  Pulse: 89  Weight: 208 lb 6.4 oz (94.5 kg)  Height: 5' 2.6" (1.59 m)   Wt Readings from Last 3  Encounters:  01/18/24 208 lb 6.4 oz (94.5 kg)  02/22/23 189 lb 6.4 oz (85.9 kg) (96%, Z= 1.78)*  02/19/22 167 lb 9.6 oz (76 kg) (92%, Z= 1.41)*   * Growth percentiles are based on CDC (Girls, 2-20 Years) data.     Growth %ile SmartLinks can only be used for patients less than 23 years old. No LMP recorded. (Menstrual status: Oral contraceptives).  Physical Exam   Assessment/Plan: ***

## 2024-01-19 ENCOUNTER — Ambulatory Visit: Payer: Self-pay | Admitting: Family

## 2024-01-19 ENCOUNTER — Other Ambulatory Visit: Payer: Self-pay | Admitting: Family

## 2024-01-19 ENCOUNTER — Encounter: Payer: Self-pay | Admitting: Family

## 2024-01-19 LAB — URINE CYTOLOGY ANCILLARY ONLY
Chlamydia: NEGATIVE
Comment: NEGATIVE
Comment: NEGATIVE
Comment: NORMAL
Neisseria Gonorrhea: NEGATIVE
Trichomonas: NEGATIVE

## 2024-01-19 MED ORDER — VITAMIN D (ERGOCALCIFEROL) 1.25 MG (50000 UNIT) PO CAPS: 50000.0000 [IU] | ORAL_CAPSULE | ORAL | 0 refills | Status: DC

## 2024-02-02 ENCOUNTER — Encounter: Payer: Self-pay | Admitting: Family

## 2024-02-02 ENCOUNTER — Telehealth (INDEPENDENT_AMBULATORY_CARE_PROVIDER_SITE_OTHER): Admitting: Family

## 2024-02-02 DIAGNOSIS — F4323 Adjustment disorder with mixed anxiety and depressed mood: Secondary | ICD-10-CM

## 2024-02-02 NOTE — Progress Notes (Signed)
 THIS RECORD MAY CONTAIN CONFIDENTIAL INFORMATION THAT SHOULD NOT BE RELEASED WITHOUT REVIEW OF THE SERVICE PROVIDER.  Virtual Follow-Up Visit via Video Note  I connected with Alison Chan  on 02/02/24 at 10:00 AM EDT by a video enabled telemedicine application and verified that I am speaking with the correct person using two identifiers.   Patient/parent location: home Provider location: remote, [place]   I discussed the limitations of evaluation and management by telemedicine and the availability of in person appointments.  I discussed that the purpose of this telehealth visit is to provide medical care while limiting exposure to the novel coronavirus.  The patient expressed understanding and agreed to proceed.   Alison Chan is a 23 y.o. female referred by No ref. provider found here today for follow-up adjustment disorder with mixed anxiety and depressed mood.    History was provided by the patient.  Supervising Physician: Dr. Lavonda Pour   Plan from Last Visit 01/18/24:   1. Weight gain (Primary) -monitoring labs today; referral to [facility]  -discussed healthy lifestyle changes - CBC with Differential/Platelet - Comprehensive metabolic panel with GFR - Hemoglobin A1c - Lipid panel - Amb Ref to Medical Weight Management   2. Adjustment disorder with mixed anxiety and depressed mood -PHQSADS consistent with increased anxiety and depressive scores; safety confirmed; restart Wellbutrin  XR 150 mg; return precautions reviewed; follow up in 2 weeks - buPROPion  (WELLBUTRIN  XL) 150 MG 24 hr tablet; Take 1 tablet (150 mg total) by mouth daily.  Dispense: 30 tablet; Refill: 0   3. Menorrhagia with irregular cycle 4. Acne vulgaris - norgestimate -ethinyl estradiol  (SPRINTEC 28) 0.25-35 MG-MCG tablet; Take 1 tablet daily. Discard placebos and take active pills for continuous cycling  Dispense: 112 tablet; Refill: 4     5. Vitamin D  deficiency -screening today  - VITAMIN D   25 Hydroxy (Vit-D Deficiency, Fractures)   6. BMI 37.0-37.9, adult -referral as above 7. Routine screening for STI (sexually transmitted infection) - Urine cytology ancillary only   8. Pregnancy examination or test, negative result - POCT urine pregnancy  Chief Complaint: -no concerns  History of Present Illness:  -started taking Wellbutrin  and feels she has more energy  -is sleeping better  -no changes in appetite, no adverse side effects -no bleeding or cramping with birth control pill use   No Known Allergies Outpatient Medications Prior to Visit  Medication Sig Dispense Refill   buPROPion  (WELLBUTRIN  XL) 150 MG 24 hr tablet Take 1 tablet (150 mg total) by mouth daily. 30 tablet 0   norgestimate -ethinyl estradiol  (SPRINTEC 28) 0.25-35 MG-MCG tablet Take 1 tablet daily. Discard placebos and take active pills for continuous cycling 112 tablet 4   Vitamin D , Ergocalciferol , (DRISDOL ) 1.25 MG (50000 UNIT) CAPS capsule Take 1 capsule (50,000 Units total) by mouth every 7 (seven) days. 8 capsule 0   No facility-administered medications prior to visit.     Patient Active Problem List   Diagnosis Date Noted   Iron deficiency anemia due to chronic blood loss 11/26/2020   Cystitis 10/08/2020   Vaginal discharge 10/08/2020   Menorrhagia with regular cycle 10/08/2020    The following portions of the patient's history were reviewed and updated as appropriate: allergies, current medications, past family history, past medical history, past social history, past surgical history, and problem list.  Visual Observations/Objective:   General Appearance: Well nourished well developed, in no apparent distress.  Eyes: conjunctiva no swelling or erythema ENT/Mouth: No hoarseness, No cough for duration of visit.  Neck: Supple  Respiratory: Respiratory effort normal, normal rate, no retractions or distress.   Cardio: Appears well-perfused, noncyanotic Musculoskeletal: no obvious  deformity Skin: visible skin without rashes, ecchymosis, erythema Neuro: Awake and oriented X 3,  Psych:  normal affect, Insight and Judgment appropriate.    Assessment/Plan: 1. Adjustment disorder with mixed anxiety and depressed mood (Primary) -continue with Wellbutrin  XL 150 mg daily in AM  -return precautions reviewed -phone number given via My Chart for [facility] office to follow up on referral placed  -referral for [facility] if she is interested in following up on therapy   I discussed the assessment and treatment plan with the patient and/or parent/guardian.  They were provided an opportunity to ask questions and all were answered.  They agreed with the plan and demonstrated an understanding of the instructions. They were advised to call back or seek an in-person evaluation in the emergency room if the symptoms worsen or if the condition fails to improve as anticipated.   Follow-up:   3 months or sooner if needed    Marijean Shouts, NP    CC: Pcp, No, No ref. provider found

## 2024-02-14 ENCOUNTER — Encounter (INDEPENDENT_AMBULATORY_CARE_PROVIDER_SITE_OTHER): Payer: Self-pay

## 2024-02-14 ENCOUNTER — Other Ambulatory Visit: Payer: Self-pay | Admitting: Family

## 2024-02-14 DIAGNOSIS — F4323 Adjustment disorder with mixed anxiety and depressed mood: Secondary | ICD-10-CM

## 2024-02-22 ENCOUNTER — Institutional Professional Consult (permissible substitution): Payer: Self-pay

## 2024-02-23 ENCOUNTER — Telehealth: Payer: Self-pay

## 2024-02-23 NOTE — Telephone Encounter (Signed)
 Called main number on file to rs missed 7/8 appt na nvm set up

## 2024-03-13 ENCOUNTER — Ambulatory Visit (INDEPENDENT_AMBULATORY_CARE_PROVIDER_SITE_OTHER): Admitting: Family

## 2024-03-13 ENCOUNTER — Encounter: Payer: Self-pay | Admitting: Family

## 2024-03-13 VITALS — BP 108/64 | HR 88 | Ht 63.0 in | Wt 209.2 lb

## 2024-03-13 DIAGNOSIS — R7309 Other abnormal glucose: Secondary | ICD-10-CM

## 2024-03-13 DIAGNOSIS — L700 Acne vulgaris: Secondary | ICD-10-CM

## 2024-03-13 DIAGNOSIS — Z6837 Body mass index (BMI) 37.0-37.9, adult: Secondary | ICD-10-CM

## 2024-03-13 DIAGNOSIS — N921 Excessive and frequent menstruation with irregular cycle: Secondary | ICD-10-CM

## 2024-03-13 DIAGNOSIS — F4323 Adjustment disorder with mixed anxiety and depressed mood: Secondary | ICD-10-CM

## 2024-03-13 DIAGNOSIS — R635 Abnormal weight gain: Secondary | ICD-10-CM | POA: Diagnosis not present

## 2024-03-13 MED ORDER — BUPROPION HCL ER (XL) 150 MG PO TB24: 150.0000 mg | ORAL_TABLET | Freq: Every day | ORAL | 0 refills | Status: DC

## 2024-03-13 MED ORDER — NORGESTIMATE-ETH ESTRADIOL 0.25-35 MG-MCG PO TABS
ORAL_TABLET | ORAL | 4 refills | Status: AC
Start: 2024-03-13 — End: ?

## 2024-03-13 MED ORDER — VITAMIN D (ERGOCALCIFEROL) 1.25 MG (50000 UNIT) PO CAPS: 50000.0000 [IU] | ORAL_CAPSULE | ORAL | 0 refills | Status: AC

## 2024-03-13 NOTE — Progress Notes (Signed)
 History was provided by the patient.  Alison Chan is a 23 y.o. female who is here for adjustment disorder with mixed anxiety and depressed mood.   PCP confirmed? No  Plan from last visit:  1. Adjustment disorder with mixed anxiety and depressed mood (Primary) -continue with Wellbutrin  XL 150 mg daily in AM  -return precautions reviewed -phone number given via My Chart for [facility] office to follow up on referral placed   -referral for [facility] if she is interested in following up on therapy   Pertinent Labs:  Vitamin D  10  A1c 5.9    Chart/Growth Chart Review:  Body mass index is 37.06 kg/m.   HPI:   -goes to school - [employer] in [place], Aug 16  -called [facility]; told her she had to pay $100 for first visit -taking Wellbutrin  with benefit  -before first year of college did not gain weight  -past year from August last year to now started to notice weight gain; was eating more at that time;  -uses nicotine vaping rarely now with Wellbutrin  use  -has been using Sprintec for some year now  -tries to go to gym a few times per week  -works at [employer] - drinks Tour manager and gatorade (regular)  -24 hr recall  Hibachi chicken and shrimp with veggies Applesauce  Hasn't eaten anything today    Patient Active Problem List   Diagnosis Date Noted   Iron deficiency anemia due to chronic blood loss 11/26/2020   Cystitis 10/08/2020   Vaginal discharge 10/08/2020   Menorrhagia with regular cycle 10/08/2020    Current Outpatient Medications on File Prior to Visit  Medication Sig Dispense Refill   buPROPion  (WELLBUTRIN  XL) 150 MG 24 hr tablet TAKE 1 TABLET BY MOUTH EVERY DAY 30 tablet 0   norgestimate -ethinyl estradiol  (SPRINTEC 28) 0.25-35 MG-MCG tablet Take 1 tablet daily. Discard placebos and take active pills for continuous cycling 112 tablet 4   Vitamin D , Ergocalciferol , (DRISDOL ) 1.25 MG (50000 UNIT) CAPS capsule  Take 1 capsule (50,000 Units total) by mouth every 7 (seven) days. 8 capsule 0   No current facility-administered medications on file prior to visit.    No Known Allergies  Physical Exam:    Vitals:   03/13/24 1430  BP: 108/64  Pulse: 88  Weight: 209 lb 3.2 oz (94.9 kg)  Height: 5' 3 (1.6 m)   Wt Readings from Last 3 Encounters:  03/13/24 209 lb 3.2 oz (94.9 kg)  01/18/24 208 lb 6.4 oz (94.5 kg)  02/22/23 189 lb 6.4 oz (85.9 kg) (96%, Z= 1.78)*   * Growth percentiles are based on CDC (Girls, 2-20 Years) data.     Growth %ile SmartLinks can only be used for patients less than 2 years old. No LMP recorded. (Menstrual status: Oral contraceptives).  Physical Exam Vitals and nursing note reviewed.  Constitutional:      General: She is not in acute distress.    Appearance: She is well-developed. She is obese.  HENT:     Mouth/Throat:     Mouth: Mucous membranes are moist.     Pharynx: No oropharyngeal exudate.  Eyes:     General: No scleral icterus.    Extraocular Movements: Extraocular movements intact.     Pupils: Pupils are equal, round, and reactive to light.  Neck:     Thyroid: No thyromegaly.  Cardiovascular:     Rate and Rhythm: Normal rate and regular rhythm.  Heart sounds: No murmur heard. Pulmonary:     Breath sounds: Normal breath sounds.  Abdominal:     Palpations: Abdomen is soft. There is no mass.     Tenderness: There is no abdominal tenderness. There is no guarding.  Musculoskeletal:     Right lower leg: No edema.     Left lower leg: No edema.  Lymphadenopathy:     Cervical: No cervical adenopathy.  Skin:    General: Skin is warm.     Capillary Refill: Capillary refill takes less than 2 seconds.     Findings: No rash.  Neurological:     General: No focal deficit present.     Mental Status: She is alert and oriented to person, place, and time.     Comments: No tremor  Psychiatric:        Mood and Affect: Mood normal.        Behavior:  Behavior normal.      Assessment/Plan:  1. Weight gain (Primary) 2. BMI 37.0-37.9, adult 3. Elevated hemoglobin A1c - Ambulatory referral to Endocrinology -Ambulatory referral to Nutrition   -increase protein intake, try to eat 2-3 meals per day + snacks -increase cardiovascular health, walking daily + strength training  -continue to drink plenty of water - 100 oz daily  -continue with Wellbutrin  XL 150 mg daily  -continue with Sprintec birth control daily

## 2024-03-15 ENCOUNTER — Ambulatory Visit: Admitting: Dietician

## 2024-03-15 NOTE — Progress Notes (Unsigned)
 Medical Nutrition Therapy  Appointment Start time:  1620  Appointment End time:  1525  Primary concerns today: prediabetes  Referral diagnosis: Elevated hemoglobin A1c, weight gain, BMI > 35 Preferred learning style: no preference indicated Learning readiness:   contemplating-ready  NUTRITION ASSESSMENT   Clinical Medical Hx:  Past Medical History:  Diagnosis Date   Depression    Environmental allergies    Medications:  Current Outpatient Medications:    buPROPion  (WELLBUTRIN  XL) 150 MG 24 hr tablet, Take 1 tablet (150 mg total) by mouth daily., Disp: 90 tablet, Rfl: 0   norgestimate -ethinyl estradiol  (SPRINTEC 28) 0.25-35 MG-MCG tablet, Take 1 tablet daily. Discard placebos and take active pills for continuous cycling, Disp: 112 tablet, Rfl: 4   Vitamin D , Ergocalciferol , (DRISDOL ) 1.25 MG (50000 UNIT) CAPS capsule, Take 1 capsule (50,000 Units total) by mouth every 7 (seven) days., Disp: 8 capsule, Rfl: 0  Labs:  Lab Results  Component Value Date   HGBA1C 5.9 (H) 01/18/2024   Last vitamin D  Lab Results  Component Value Date   VD25OH 10 (L) 01/18/2024   Notable Signs/Symptoms:  Wt Readings from Last 3 Encounters:  03/13/24 209 lb 3.2 oz (94.9 kg)  01/18/24 208 lb 6.4 oz (94.5 kg)  02/22/23 189 lb 6.4 oz (85.9 kg) (96%, Z= 1.78)*   * Growth percentiles are based on CDC (Girls, 2-20 Years) data.   Lifestyle & Dietary Hx Pt present today alone. Pt reports lactose intolerance and can tolerate yogurt. Pt reports she plans to remain connect to therapy. Pt reports a recent weight gain that is undesired. 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 Pt reports her appetite is well controlled and states she is not hungry after mid day. Pt reports typical intake of 1 meal and 1 snack about 3 times weekly.  Pt reports she does her own shopping for her dorm when school is in session. Pt reports she eat out once weekly. Pt reports she has a gym  partner. All Pt's questions were answered during this encounter.   Estimated daily fluid intake: unknown Supplements: none Sleep: fair;  5 average hours nightly  Stress / self-care: 2 out of 10 / self care includes: pray, music Current average weekly physical activity: gym 3/d/w 60+ minutes stair master 30- minutes; weights 30 minutes  Wake up: 8:30 pm  Work-10p- 4a and/or 4a- 11a 24-Hr Dietary Recall First Meal: skips  Snack: ~ 4 am: cheeses its or 2 pop tarts  Second Meal: skips Snack: skips  Third Meal: ~ 3 pm -5 pm: pasta salad made with cheese, onions, tomato, cucumbers or rice, chicken, shrimp, broccoli, onion, asparagus  Snack: none Beverages: water, cheerwine once weekly, ~ 12 oz tropical minute maid 3/d/w  NUTRITION DIAGNOSIS  NB-1.1 Food and nutrition-related knowledge deficit As related to no prior nutrition education .  As evidenced by Pt's reports and dietary recall.   NUTRITION INTERVENTION  Nutrition education (E-1) on the following topics:  Fruits & Vegetables: Aim to fill half your plate with a variety of fruits and vegetables. They are rich in vitamins, minerals, and fiber, and can help reduce the risk of chronic diseases. Choose a colorful assortment of fruits and vegetables to ensure you get a wide range of nutrients. Grains and Starches: Make at least half of your grain choices whole grains, such as brown rice, whole wheat bread, and oats. Whole grains provide fiber, which aids in digestion and healthy cholesterol levels.  Aim for whole forms of starchy vegetables such as potatoes, sweet potatoes, beans, peas, and corn, which are fiber rich and provide many vitamins and minerals.  Protein: Incorporate lean sources of protein, such as poultry, fish, beans, nuts, and seeds, into your meals. Protein is essential for building and repairing tissues, staying full, balancing blood sugar, as well as supporting immune function. Dairy: Include low-fat or fat-free dairy products  like milk, yogurt, and cheese in your diet. Dairy foods are excellent sources of calcium and vitamin D , which are crucial for bone health.  Physical Activity: Aim for 60 minutes of physical activity daily. Regular physical activity promotes overall health-including helping to reduce risk for heart disease and diabetes, promoting mental health, and helping us  sleep better.    Handouts Provided Include  Move Your way-DHHS Plate Planner- [employer] Carb controlled Snacks Ideas  Learning Style & Readiness for Change Teaching method utilized: Visual & Auditory  Demonstrated degree of understanding via: Teach Back  Barriers to learning/adherence to lifestyle change: time management  Goals Established by Pt  500 mg calcium citrate USP certified twice daily Decreasing sugary sweetened beverages aiming for none  Aim for balanced meals on a schedule using the plate planner -Consider a balanced snacks in replace in replace of a skipped meals   MONITORING & EVALUATION Dietary intake, weekly physical activity  Next Steps  Patient is to return PRN.

## 2024-03-17 ENCOUNTER — Encounter: Attending: Family | Admitting: Dietician

## 2024-03-17 DIAGNOSIS — Z6837 Body mass index (BMI) 37.0-37.9, adult: Secondary | ICD-10-CM | POA: Insufficient documentation

## 2024-03-17 DIAGNOSIS — R635 Abnormal weight gain: Secondary | ICD-10-CM | POA: Diagnosis present

## 2024-03-17 DIAGNOSIS — R7309 Other abnormal glucose: Secondary | ICD-10-CM | POA: Insufficient documentation

## 2024-03-17 NOTE — Patient Instructions (Signed)
 500 mg calcium citrate USP certified twice daily Decreasing sugary sweetened beverages aiming for none  Aim for balanced meals on a schedule using the plate planner -Consider a balanced snacks in replace in replace of a skipped meals

## 2024-03-21 ENCOUNTER — Encounter: Payer: Self-pay | Admitting: Family

## 2024-03-24 ENCOUNTER — Other Ambulatory Visit: Payer: Self-pay | Admitting: Family

## 2024-03-24 DIAGNOSIS — Z7187 Encounter for pediatric-to-adult transition counseling: Secondary | ICD-10-CM

## 2024-04-05 ENCOUNTER — Ambulatory Visit

## 2024-04-05 DIAGNOSIS — Z0389 Encounter for observation for other suspected diseases and conditions ruled out: Secondary | ICD-10-CM

## 2024-04-05 NOTE — BH Specialist Note (Signed)
 Integrated Behavioral Health via Telemedicine Visit  04/05/2024 Alison Chan [id number]  NO SHOW: Patient did not log in for video visit. [facility] sent link ata 1457 and remained in visit until 1515.  [address] [place], LCSWA

## 2024-04-12 ENCOUNTER — Telehealth: Admitting: Family

## 2024-04-12 ENCOUNTER — Encounter: Payer: Self-pay | Admitting: Family

## 2024-04-12 DIAGNOSIS — R635 Abnormal weight gain: Secondary | ICD-10-CM

## 2024-04-12 NOTE — Progress Notes (Signed)
 Opened in error. Closed for admin purposes. Link sent x 15 min.  Patient not seen.

## 2024-04-21 ENCOUNTER — Telehealth: Admitting: Family

## 2024-04-21 ENCOUNTER — Encounter: Payer: Self-pay | Admitting: Family

## 2024-04-21 DIAGNOSIS — F4323 Adjustment disorder with mixed anxiety and depressed mood: Secondary | ICD-10-CM

## 2024-04-21 DIAGNOSIS — G4726 Circadian rhythm sleep disorder, shift work type: Secondary | ICD-10-CM

## 2024-04-21 MED ORDER — BUPROPION HCL ER (XL) 150 MG PO TB24
150.0000 mg | ORAL_TABLET | Freq: Every day | ORAL | 0 refills | Status: AC
Start: 2024-04-21 — End: ?

## 2024-04-21 NOTE — Progress Notes (Signed)
 THIS RECORD MAY CONTAIN CONFIDENTIAL INFORMATION THAT SHOULD NOT BE RELEASED WITHOUT REVIEW OF THE SERVICE PROVIDER.  Virtual Follow-Up Visit via Video Note  I connected with Alison Chan  on 04/21/24 at 11:00 AM EDT by a video enabled telemedicine application and verified that I am speaking with the correct person using two identifiers.   Patient/parent location: car, [place] location: remote, [place]   I discussed the limitations of evaluation and management by telemedicine and the availability of in person appointments.  I discussed that the purpose of this telehealth visit is to provide medical care while limiting exposure to the novel coronavirus.  The  expressed understanding and agreed to proceed.   Alison Chan is a 23 y.o. female referred by No ref. provider found here today for follow-up of adjustment disorder with mixed anxiety and depressed mood.   History was provided by the patient.  Supervising Physician: Dr. Kreg Helena   Plan from Last Visit:   1. Weight gain (Primary) 2. BMI 37.0-37.9, adult 3. Elevated hemoglobin A1c - Ambulatory referral to Endocrinology -Ambulatory referral to Nutrition    -increase protein intake, try to eat 2-3 meals per day + snacks -increase cardiovascular health, walking daily + strength training  -continue to drink plenty of water - 100 oz daily  -continue with Wellbutrin  XL 150 mg daily  -continue with Sprintec birth control daily   Chief Complaint: Needs refill   History of Present Illness:  -last week was the last dose she took of Wellbutrin ; she feels it is working and helping when she takes it; just a lot going on at work -safety confirmed -LMP continuous cycling; no bleeding or spotting  -sexually active, no pain with intercourse   -describes episodes of waking at night with racing heart and feeling out of breath; she estimates this happens 6-8 times in a month -she works 3rd shift so she will sleep during the  day and will sleep about 3-4 hours, get up then get back to sleep -she does endorse snoring -she denies any other episodes or concerns of chest pain, SOB, no palpitations outside of waking from sleep  -she estimates getting about 8 hours of sleep total and does feel rested when she gets to work  -has not yet picked up Vitamin D  supplement  -plans to call [facility] to set up PCP appt     04/21/2024   11:03 AM 03/17/2024    4:27 PM 01/18/2024    4:59 PM  PHQ-SADS Last 3 Score only  PHQ-15 Score 6  9  Total GAD-7 Score 6  10  PHQ Adolescent Score 6 0 8     No Known Allergies Outpatient Medications Prior to Visit  Medication Sig Dispense Refill   buPROPion  (WELLBUTRIN  XL) 150 MG 24 hr tablet Take 1 tablet (150 mg total) by mouth daily. 90 tablet 0   norgestimate -ethinyl estradiol  (SPRINTEC 28) 0.25-35 MG-MCG tablet Take 1 tablet daily. Discard placebos and take active pills for continuous cycling 112 tablet 4   Vitamin D , Ergocalciferol , (DRISDOL ) 1.25 MG (50000 UNIT) CAPS capsule Take 1 capsule (50,000 Units total) by mouth every 7 (seven) days. 8 capsule 0   No facility-administered medications prior to visit.     Patient Active Problem List   Diagnosis Date Noted   Iron deficiency anemia due to chronic blood loss 11/26/2020   Cystitis 10/08/2020   Vaginal discharge 10/08/2020   Menorrhagia with regular cycle 10/08/2020   The following portions of the patient's history were  reviewed and updated as appropriate: allergies, current medications, past family history, past medical history, past social history, past surgical history, and problem list.  Visual Observations/Objective:   General Appearance: Well nourished well developed, in no apparent distress.  Eyes: conjunctiva no swelling or erythema ENT/Mouth: No hoarseness, No cough for duration of visit.  Neck: Supple  Respiratory: Respiratory effort normal, normal rate, no retractions or distress.   Cardio: Appears  well-perfused, noncyanotic Musculoskeletal: no obvious deformity Skin: visible skin without rashes, ecchymosis, erythema Neuro: Awake and oriented X 3,  Psych:  normal affect, Insight and Judgment appropriate.    Assessment/Plan: 1. Adjustment disorder with mixed anxiety and depressed mood (Primary) -has missed medication for at least a week;  Does feel it is helpful; plan is to restart medication; aware of return precautions including worsening sleep, changes in mood or concerns for safety; will will check back in one month to see how she is doing; she would benefit from therapy; continue to assess willingness  - buPROPion  (WELLBUTRIN  XL) 150 MG 24 hr tablet; Take 1 tablet (150 mg total) by mouth daily.  Dispense: 90 tablet; Refill: 0  2. Sleep disorder, shift work -consideration for concern for OSA due to obesity, snoring and shift work - concern for risks of cardiovascular and metabolic impact -would consider evaluation with sleep study, but will defer to PCP once she is established there; otherwise I will revisit this with her after we check back in one month  I discussed the assessment and treatment plan with the patient and/or parent/guardian.  They were provided an opportunity to ask questions and all were answered.  They agreed with the plan and demonstrated an understanding of the instructions. They were advised to call back or seek an in-person evaluation in the emergency room if the symptoms worsen or if the condition fails to improve as anticipated.   Follow-up:   one month or sooner if needed  Bari CHRISTELLA Molt, NP    CC: Pcp, No, No ref. provider found

## 2024-06-21 LAB — CBC WITH DIFFERENTIAL/PLATELET
Absolute Lymphocytes: 2995 {cells}/uL (ref 850–3900)
Absolute Monocytes: 718 {cells}/uL (ref 200–950)
Basophils Absolute: 31 {cells}/uL (ref 0–200)
Basophils Relative: 0.4 %
Eosinophils Absolute: 179 {cells}/uL (ref 15–500)
Eosinophils Relative: 2.3 %
HCT: 37.6 % (ref 35.0–45.0)
Hemoglobin: 11.9 g/dL (ref 11.7–15.5)
MCH: 26.2 pg — ABNORMAL LOW (ref 27.0–33.0)
MCHC: 31.6 g/dL — ABNORMAL LOW (ref 32.0–36.0)
MCV: 82.8 fL (ref 80.0–100.0)
MPV: 10.2 fL (ref 7.5–12.5)
Monocytes Relative: 9.2 %
Neutro Abs: 3877 {cells}/uL (ref 1500–7800)
Neutrophils Relative %: 49.7 %
Platelets: 329 Thousand/uL (ref 140–400)
RBC: 4.54 Million/uL (ref 3.80–5.10)
RDW: 12.7 % (ref 11.0–15.0)
Total Lymphocyte: 38.4 %
WBC: 7.8 Thousand/uL (ref 3.8–10.8)

## 2024-06-21 LAB — VITAMIN D 25 HYDROXY (VIT D DEFICIENCY, FRACTURES): Vit D, 25-Hydroxy: 10 ng/mL — ABNORMAL LOW (ref 30–100)

## 2024-06-21 LAB — COMPREHENSIVE METABOLIC PANEL WITH GFR
AG Ratio: 1.1 (calc) (ref 1.0–2.5)
ALT: 16 U/L (ref 6–29)
AST: 20 U/L (ref 10–30)
Albumin: 3.6 g/dL (ref 3.6–5.1)
Alkaline phosphatase (APISO): 52 U/L (ref 31–125)
BUN: 10 mg/dL (ref 7–25)
CO2: 23 mmol/L (ref 20–32)
Calcium: 8.7 mg/dL (ref 8.6–10.2)
Chloride: 107 mmol/L (ref 98–110)
Creat: 0.89 mg/dL (ref 0.50–0.96)
Globulin: 3.2 g/dL (ref 1.9–3.7)
Glucose, Bld: 93 mg/dL (ref 65–139)
Potassium: 4 mmol/L (ref 3.5–5.3)
Sodium: 139 mmol/L (ref 135–146)
Total Bilirubin: 0.3 mg/dL (ref 0.2–1.2)
Total Protein: 6.8 g/dL (ref 6.1–8.1)
eGFR: 95 mL/min/1.73m2 (ref >=60)

## 2024-06-21 LAB — HEMOGLOBIN A1C
Hgb A1c MFr Bld: 5.9 % — ABNORMAL HIGH (ref <5.7)
Mean Plasma Glucose: 123 mg/dL
eAG (mmol/L): 6.8 mmol/L

## 2024-06-21 LAB — LIPID PANEL
Cholesterol: 121 mg/dL (ref <200)
HDL: 64 mg/dL (ref >=50)
LDL Cholesterol (Calc): 41 mg/dL
Non-HDL Cholesterol (Calc): 57 mg/dL (ref <130)
Total CHOL/HDL Ratio: 1.9 (calc) (ref <5.0)
Triglycerides: 78 mg/dL (ref <150)

## 2024-06-22 ENCOUNTER — Encounter: Payer: Self-pay | Admitting: Family

## 2024-06-23 ENCOUNTER — Telehealth: Admitting: Family

## 2024-06-25 ENCOUNTER — Other Ambulatory Visit: Payer: Self-pay

## 2024-06-25 ENCOUNTER — Emergency Department (HOSPITAL_COMMUNITY)
Admission: EM | Admit: 2024-06-25 | Discharge: 2024-06-26 | Disposition: A | Discharge status: Home / Self Care | Attending: Emergency Medicine | Admitting: Emergency Medicine

## 2024-06-25 ENCOUNTER — Encounter (HOSPITAL_COMMUNITY): Payer: Self-pay | Admitting: *Deleted

## 2024-06-25 DIAGNOSIS — J069 Acute upper respiratory infection, unspecified: Secondary | ICD-10-CM | POA: Diagnosis not present

## 2024-06-25 DIAGNOSIS — R059 Cough, unspecified: Secondary | ICD-10-CM | POA: Diagnosis present

## 2024-06-25 NOTE — ED Triage Notes (Signed)
 Pt has a dry cough and a runny nose since Friday  no temp.  Lmp cb

## 2024-06-26 MED ORDER — BENZONATATE 100 MG PO CAPS: 100.0000 mg | ORAL_CAPSULE | Freq: Three times a day (TID) | ORAL | 0 refills | Status: AC

## 2024-06-26 NOTE — ED Provider Notes (Signed)
 [place] [facility]Y DEPARTMENT AT [facility] Provider Note   CSN: [id number] Arrival date & time: 06/25/24  2329     Patient presents with: Cough   Alison Chan is a 23 y.o. female.  Patient with past medical history significant for environmental allergies presents the Emergency Department complaining of a dry cough with rhinorrhea since Friday of this week.  She states that her mother is sick with a respiratory illness.  She denies shortness of breath, chest pain, abdominal pain, sore throat, nausea, vomiting, fever.    Cough      Prior to Admission medications   Medication Sig Start Date End Date Taking? Authorizing Provider  benzonatate (TESSALON) 100 MG capsule Take 1 capsule (100 mg total) by mouth every 8 (eight) hours. 06/26/24  Yes Logan Ubaldo NOVAK, PA-C  buPROPion  (WELLBUTRIN  XL) 150 MG 24 hr tablet Take 1 tablet (150 mg total) by mouth daily. 04/21/24   Joshua Bari HERO, NP  norgestimate -ethinyl estradiol  (SPRINTEC 28) 0.25-35 MG-MCG tablet Take 1 tablet daily. Discard placebos and take active pills for continuous cycling 03/13/24   Joshua Bari HERO, NP  Vitamin D , Ergocalciferol , (DRISDOL ) 1.25 MG (50000 UNIT) CAPS capsule Take 1 capsule (50,000 Units total) by mouth every 7 (seven) days. 03/13/24   Joshua Bari HERO, NP    Allergies: Patient has no known allergies.    Review of Systems  Respiratory:  Positive for cough.     Updated Vital Signs BP (!) 147/84 (BP Location: Right Arm)   Pulse 90   Temp 98.3 F (36.8 C)   Resp 12   Ht 5' 3 (1.6 m)   Wt 94.9 kg   SpO2 100%   BMI 37.06 kg/m   Physical Exam Vitals and nursing note reviewed.  Constitutional:      General: She is not in acute distress.    Appearance: She is well-developed.  HENT:     Head: Normocephalic and atraumatic.  Eyes:     Conjunctiva/sclera: Conjunctivae normal.  Cardiovascular:     Rate and Rhythm: Normal rate and regular rhythm.  Pulmonary:     Effort: Pulmonary effort is  normal. No respiratory distress.     Breath sounds: Normal breath sounds.  Abdominal:     Palpations: Abdomen is soft.     Tenderness: There is no abdominal tenderness.  Musculoskeletal:        General: No swelling.     Cervical back: Neck supple.  Skin:    General: Skin is warm and dry.     Capillary Refill: Capillary refill takes less than 2 seconds.  Neurological:     Mental Status: She is alert.  Psychiatric:        Mood and Affect: Mood normal.     (all labs ordered are listed, but only abnormal results are displayed) Labs Reviewed - No data to display  EKG: None  Radiology: No results found.   Procedures   Medications Ordered in the ED - No data to display                                  Medical Decision Making  This patient presents to the ED for concern of cough, this involves an extensive number of treatment options, and is a complaint that carries with it a high risk of complications and morbidity.  The differential diagnosis includes upper respiratory infection, bronchitis, pneumonia, others   Co morbidities /  Chronic conditions that complicate the patient evaluation  Environmental allergies   Additional history obtained:  Additional history obtained from EMR  Social Determinants of Health:  Patient has Medicaid for her primary health insurance type   Test / Admission - Considered:  Patient with dry nonproductive cough.  Lungs clear to auscultation bilaterally.  No wheezes, rhonchi, rales.  Patient is afebrile.  Suspect this is likely an upper respiratory infection as her mother has similar but worse symptoms.  Plan to discharge with prescription for [facility].  Considered COVID/flu testing but with no fever I feel positive test result is unlikely and also do not feel that it would change management.  Patient stable for discharge home at this time.  Return precautions including shortness of breath provided.      Final diagnoses:  Viral  URI with cough    ED Discharge Orders          Ordered    benzonatate (TESSALON) 100 MG capsule  Every 8 hours        06/26/24 0210               Logan Ubaldo NOVAK, PA-C 06/26/24 0211    Haze Lonni PARAS, MD 06/26/24 [phone]

## 2024-06-26 NOTE — Discharge Instructions (Signed)
 Your workup is consistent with a viral upper respiratory infection.  I have prescribed Tessalon which is a cough medication.  Please take as directed.  If you develop shortness of breath or other life-threatening symptoms return to the emergency department.

## 2024-08-06 ENCOUNTER — Encounter (HOSPITAL_COMMUNITY): Payer: Self-pay | Admitting: *Deleted

## 2024-08-06 ENCOUNTER — Other Ambulatory Visit: Payer: Self-pay

## 2024-08-06 ENCOUNTER — Emergency Department (HOSPITAL_COMMUNITY): Admission: EM | Admit: 2024-08-06 | Discharge: 2024-08-06 | Disposition: A | Discharge status: Home / Self Care

## 2024-08-06 DIAGNOSIS — R059 Cough, unspecified: Secondary | ICD-10-CM | POA: Diagnosis present

## 2024-08-06 DIAGNOSIS — J020 Streptococcal pharyngitis: Secondary | ICD-10-CM

## 2024-08-06 DIAGNOSIS — J101 Influenza due to other identified influenza virus with other respiratory manifestations: Secondary | ICD-10-CM | POA: Insufficient documentation

## 2024-08-06 DIAGNOSIS — J111 Influenza due to unidentified influenza virus with other respiratory manifestations: Secondary | ICD-10-CM

## 2024-08-06 LAB — COMPREHENSIVE METABOLIC PANEL WITH GFR
ALT: 23 U/L (ref 0–44)
AST: 28 U/L (ref 15–41)
Albumin: 3.8 g/dL (ref 3.5–5.0)
Alkaline Phosphatase: 60 U/L (ref 38–126)
Anion gap: 10 (ref 5–15)
BUN: 9 mg/dL (ref 6–20)
CO2: 22 mmol/L (ref 22–32)
Calcium: 8.3 mg/dL — ABNORMAL LOW (ref 8.9–10.3)
Chloride: 102 mmol/L (ref 98–111)
Creatinine, Ser: 0.94 mg/dL (ref 0.44–1.00)
GFR, Estimated: >60 mL/min
Glucose, Bld: 108 mg/dL — ABNORMAL HIGH (ref 70–99)
Potassium: 3.8 mmol/L (ref 3.5–5.1)
Sodium: 134 mmol/L — ABNORMAL LOW (ref 135–145)
Total Bilirubin: 0.6 mg/dL (ref 0.0–1.2)
Total Protein: 7.2 g/dL (ref 6.5–8.1)

## 2024-08-06 LAB — URINALYSIS, ROUTINE W REFLEX MICROSCOPIC
Bilirubin Urine: NEGATIVE
Glucose, UA: NEGATIVE mg/dL
Ketones, ur: NEGATIVE mg/dL
Leukocytes,Ua: NEGATIVE
Nitrite: NEGATIVE
Protein, ur: NEGATIVE mg/dL
Specific Gravity, Urine: <1.005 — ABNORMAL LOW (ref 1.005–1.030)
pH: 6 (ref 5.0–8.0)

## 2024-08-06 LAB — RESP PANEL BY RT-PCR (RSV, FLU A&B, COVID)  RVPGX2
Influenza A by PCR: POSITIVE — AB
Influenza B by PCR: NEGATIVE
Resp Syncytial Virus by PCR: NEGATIVE
SARS Coronavirus 2 by RT PCR: NEGATIVE

## 2024-08-06 LAB — CBC
HCT: 38.8 % (ref 36.0–46.0)
Hemoglobin: 12.4 g/dL (ref 12.0–15.0)
MCH: 26.9 pg (ref 26.0–34.0)
MCHC: 32 g/dL (ref 30.0–36.0)
MCV: 84.2 fL (ref 80.0–100.0)
Platelets: 285 K/uL (ref 150–400)
RBC: 4.61 MIL/uL (ref 3.87–5.11)
RDW: 13.5 % (ref 11.5–15.5)
WBC: 9.9 K/uL (ref 4.0–10.5)
nRBC: 0 % (ref 0.0–0.2)

## 2024-08-06 LAB — URINALYSIS, MICROSCOPIC (REFLEX)

## 2024-08-06 LAB — GROUP A STREP BY PCR: Group A Strep by PCR: DETECTED — AB

## 2024-08-06 LAB — I-STAT CG4 LACTIC ACID, ED: Lactic Acid, Venous: 0.9 mmol/L (ref 0.5–1.9)

## 2024-08-06 LAB — HCG, SERUM, QUALITATIVE: Preg, Serum: NEGATIVE

## 2024-08-06 MED ORDER — AMOXICILLIN 500 MG PO CAPS
500.0000 mg | ORAL_CAPSULE | Freq: Once | ORAL | Status: AC
  Administered 2024-08-06: 500 mg via ORAL
  Filled 2024-08-06: qty 1

## 2024-08-06 MED ORDER — DEXAMETHASONE SOD PHOSPHATE PF 10 MG/ML IJ SOLN
8.0000 mg | Freq: Once | INTRAMUSCULAR | Status: AC
  Administered 2024-08-06: 8 mg via INTRAMUSCULAR

## 2024-08-06 MED ORDER — PENICILLIN G BENZATHINE 1200000 UNIT/2ML IM SUSY
1.2000 10*6.[IU] | PREFILLED_SYRINGE | Freq: Once | INTRAMUSCULAR | Status: DC
  Filled 2024-08-06: qty 2

## 2024-08-06 MED ORDER — OSELTAMIVIR PHOSPHATE 75 MG PO CAPS: 75.0000 mg | ORAL_CAPSULE | Freq: Two times a day (BID) | ORAL | 0 refills | Status: AC

## 2024-08-06 MED ORDER — AMOXICILLIN 500 MG PO CAPS: 500.0000 mg | ORAL_CAPSULE | Freq: Two times a day (BID) | ORAL | 0 refills | Status: AC

## 2024-08-06 MED ORDER — ACETAMINOPHEN 325 MG PO TABS
325.0000 mg | ORAL_TABLET | Freq: Once | ORAL | Status: AC
  Administered 2024-08-06: 325 mg via ORAL
  Filled 2024-08-06: qty 1

## 2024-08-06 MED ORDER — SODIUM CHLORIDE 0.9 % IV BOLUS: 1000.0000 mL | Freq: Once | INTRAVENOUS | Status: DC

## 2024-08-06 MED ORDER — SODIUM CHLORIDE 0.9 % IV BOLUS
1000.0000 mL | Freq: Once | INTRAVENOUS | Status: AC
  Administered 2024-08-06: 1000 mL via INTRAVENOUS

## 2024-08-06 NOTE — ED Triage Notes (Signed)
 The pt has been ill since yesterday body aches sore throat fever headache  she has a family member  ill at home also with the same   lmp bc

## 2024-08-06 NOTE — ED Provider Notes (Addendum)
 " [place] [facility]ERGENCY DEPARTMENT AT [facility] Provider Note   CSN: [id number] Arrival date & time: 08/06/24  0240     Patient presents with: Generalized Body Aches   Alison Chan is a 23 y.o. female.   HPI  23 year old female presenting with cough, fever, sore throat.  Patient reports this started yesterday when she woke up.  She also complains of bodyaches and headache.  She has been around somebody sick recently.  She has been taking Mucinex to help with the pain and bodyaches.  She reports that it helps a little but still not feeling well.  She also complains that with her headache comes a little bit of dizziness.  She reports that she has been drinking lots of water.  Denies any chest pain or shortness of breath.  She denies any abdominal pain.  She denies any constipation diarrhea or nausea vomiting.    Prior to Admission medications  Medication Sig Start Date End Date Taking? Authorizing Provider  benzonatate  (TESSALON ) 100 MG capsule Take 1 capsule (100 mg total) by mouth every 8 (eight) hours. 06/26/24   Logan Ubaldo NOVAK, PA-C  buPROPion  (WELLBUTRIN  XL) 150 MG 24 hr tablet Take 1 tablet (150 mg total) by mouth daily. 04/21/24   Joshua Bari HERO, NP  norgestimate -ethinyl estradiol  (SPRINTEC 28) 0.25-35 MG-MCG tablet Take 1 tablet daily. Discard placebos and take active pills for continuous cycling 03/13/24   Joshua Bari HERO, NP  Vitamin D , Ergocalciferol , (DRISDOL ) 1.25 MG (50000 UNIT) CAPS capsule Take 1 capsule (50,000 Units total) by mouth every 7 (seven) days. 03/13/24   Joshua Bari HERO, NP    Allergies: Patient has no known allergies.    Review of Systems  All other systems reviewed and are negative.   Updated Vital Signs BP 120/63 (BP Location: Right Arm)   Pulse (!) 118   Temp 100.1 F (37.8 C) (Oral)   Resp 18   Ht 5' 3 (1.6 m)   Wt 94.9 kg   SpO2 100%   BMI 37.06 kg/m   Physical Exam Vitals and nursing note reviewed.  HENT:     Nose: No  congestion or rhinorrhea.     Mouth/Throat:     Pharynx: Oropharynx is clear. Uvula midline. Posterior oropharyngeal erythema present. No oropharyngeal exudate.     Tonsils: No tonsillar exudate or tonsillar abscesses.  Cardiovascular:     Rate and Rhythm: Normal rate and regular rhythm.     Pulses: Normal pulses.  Pulmonary:     Effort: Pulmonary effort is normal. No respiratory distress.     Breath sounds: Normal breath sounds. No wheezing, rhonchi or rales.  Chest:     Chest wall: No tenderness.  Abdominal:     General: Abdomen is flat. Bowel sounds are normal.     Palpations: Abdomen is soft.  Skin:    General: Skin is warm and dry.  Neurological:     General: No focal deficit present.     Mental Status: She is alert.     (all labs ordered are listed, but only abnormal results are displayed) Labs Reviewed  RESP PANEL BY RT-PCR (RSV, FLU A&B, COVID)  RVPGX2 - Abnormal; Notable for the following components:      Result Value   Influenza A by PCR POSITIVE (*)    All other components within normal limits  GROUP A STREP BY PCR - Abnormal; Notable for the following components:   Group A Strep by PCR DETECTED (*)  All other components within normal limits  COMPREHENSIVE METABOLIC PANEL WITH GFR - Abnormal; Notable for the following components:   Sodium 134 (*)    Glucose, Bld 108 (*)    Calcium 8.3 (*)    All other components within normal limits  URINALYSIS, ROUTINE W REFLEX MICROSCOPIC - Abnormal; Notable for the following components:   Specific Gravity, Urine <1.005 (*)    Hgb urine dipstick TRACE (*)    All other components within normal limits  URINALYSIS, MICROSCOPIC (REFLEX) - Abnormal; Notable for the following components:   Bacteria, UA RARE (*)    All other components within normal limits  CBC  HCG, SERUM, QUALITATIVE    EKG: EKG Interpretation Date/Time:  Sunday August 06 2024 02:49:10 EST Ventricular Rate:  112 PR Interval:  120 QRS Duration:  74 QT  Interval:  326 QTC Calculation: 444 R Axis:   84  Text Interpretation: Sinus tachycardia Nonspecific T wave abnormality Abnormal ECG When compared with ECG of 06-Nov-2019 23:47, HEART RATE has increased Confirmed by Raford Lenis ([id number]) on 08/06/2024 5:28:58 AM  Radiology: No results found.   Procedures   Medications Ordered in the ED  acetaminophen  (TYLENOL ) tablet 325 mg (has no administration in time range)  penicillin  g benzathine (BICILLIN  LA) 1200000 UNIT/2ML injection 1.2 Million Units (has no administration in time range)  dexamethasone  (DECADRON ) injection 8 mg (has no administration in time range)    Clinical Course as of 08/06/24 0922  Sun Aug 06, 2024  0704 Resp Panel positive for Flu A. Strep Test positive. Physical exam with erythema of tonsils without swelling or exudates. Patient overall well-appearing and NAD. Will provide with dose of steroid and Bicillin . -SFM [SM]  0920 Bicillin  not in stock - will proceed with Amoxicillin  [SM]    Clinical Course User Index [SM] Hoy Nidia FALCON, PA-C                                 Medical Decision Making Amount and/or Complexity of Data Reviewed Labs: ordered.  Risk OTC drugs. Prescription drug management.   Impression: 23 year old female presenting with sore throat and bodyaches.  Differential diagnosis include flu, COVID, RSV, strep pharyngitis, peritonsillar abscess, tonsillar abscess  Additional History: Patient was able to provide history.  I reviewed her outpatient notes.  Labs: Group A strep was detected.  Respiratory panel showed flu A.  Imaging: I interpreted EKG and it showed sinus tach.  ED Course/Meds: Patient remained stable while in the ER. Patient was diagnosed with both flu A and strep pharyngitis.  I discussed with the patient whether she wanted to do penicillin  shot or amoxicillin .  Patient stated that she would like to do penicillin .  To help with the sore throat I also did dexamethasone  shot.   I discussed with the patient the risk and benefits of beginning Tamiflu .  Patient was also given Tylenol  for body aches and fever.  Her symptoms started less than 48 hours ago and she stated that she would like to do the Tamiflu .  I did send that to her pharmacy.  Educated on the importance of fluids and resting.  Patient was educated on signs and symptoms of when to return to the ER and and verbally agreed that she understood.  8:36 AM I was contacted by the nurse to stated that there was a shortage on penicillin  and it was only being used for pregnant patients.  I spoke to  the patient and she was okay with taking amoxicillin .  I educated on the importance of taking the antibiotic until it was completely done.  8:48 AM Nursing staff notified that she was hypotensive and with an elevated heart rate.  Lactic acid and blood cultures were ordered.  Patient was also started on 1000 bolus of normal saline. 10:39 AM On reassessment patient is stable her blood pressure had increased to 114/68.  She states that she is feeling much better.  Her lactic acid came back at 0.9.  Patient was educated that if she starts to feel any worse having any shortness of breath or chest pain to return to the ER     Final diagnoses:  None    ED Discharge Orders     None          Alison Chan 08/06/24 0732    Raford Lenis, MD 08/06/24 0733    Alison Almarie KANDICE, PA-C 08/06/24 0837    Alison Almarie KANDICE, PA-C 08/06/24 1042    Raford Lenis, MD 08/06/24 2244  "

## 2024-08-06 NOTE — Discharge Instructions (Addendum)
 Begin taking Tamiflu  when you pick it up.  You received first dose of your amoxicillin  in the ER.  When you pick up your prescription today you may take the second dose.  Tomorrow begin taking 2 a day.  Make sure that you complete the whole antibiotic even after feeling better. Continue to rest and stay hydrated.  You may take Tylenol  or ibuprofen  for your fever.  If you start develop any shortness of breath or chest pain please return to the ER.  Alternating between 650 mg Tylenol  and 400 mg Advil : The best way to alternate taking Acetaminophen  (example Tylenol ) and Ibuprofen  (example Advil /Motrin ) is to take them 3 hours apart. For example, if you take ibuprofen  at 6 am you can then take Tylenol  at 9 am. You can continue this regimen throughout the day, making sure you do not exceed the recommended maximum dose for each drug.

## 2024-08-08 ENCOUNTER — Telehealth: Payer: Self-pay

## 2024-08-08 NOTE — Telephone Encounter (Signed)
 Received call from patient's mother requesting an extension of her work excuse note. Per mother, patient is still not feeling well but is no longer febrile. Encouraged patient's mother to assist patient in making a pcp f/u appointment should additional time away from work be needed.   Merilee Batty, MSN, RN Case Management [phone]

## 2024-08-11 LAB — CULTURE, BLOOD (SINGLE)
Culture: NO GROWTH
Special Requests: ADEQUATE

## 2024-10-12 ENCOUNTER — Ambulatory Visit: Admitting: "Endocrinology
